# Patient Record
Sex: Male | Born: 1960
Health system: Southern US, Community
[De-identification: ages and names within clinical notes are randomized; demographics above are authoritative.]

## PROBLEM LIST (undated history)

## (undated) DIAGNOSIS — F419 Anxiety disorder, unspecified: Secondary | ICD-10-CM

## (undated) DIAGNOSIS — M1990 Unspecified osteoarthritis, unspecified site: Secondary | ICD-10-CM

## (undated) DIAGNOSIS — R112 Nausea with vomiting, unspecified: Secondary | ICD-10-CM

## (undated) DIAGNOSIS — Z87442 Personal history of urinary calculi: Secondary | ICD-10-CM

## (undated) DIAGNOSIS — E349 Endocrine disorder, unspecified: Secondary | ICD-10-CM

## (undated) DIAGNOSIS — M4316 Spondylolisthesis, lumbar region: Secondary | ICD-10-CM

## (undated) DIAGNOSIS — M5412 Radiculopathy, cervical region: Secondary | ICD-10-CM

## (undated) DIAGNOSIS — G4733 Obstructive sleep apnea (adult) (pediatric): Secondary | ICD-10-CM

## (undated) DIAGNOSIS — K603 Anal fistula, unspecified: Secondary | ICD-10-CM

## (undated) DIAGNOSIS — T7840XA Allergy, unspecified, initial encounter: Secondary | ICD-10-CM

## (undated) DIAGNOSIS — M47816 Spondylosis without myelopathy or radiculopathy, lumbar region: Secondary | ICD-10-CM

## (undated) DIAGNOSIS — K219 Gastro-esophageal reflux disease without esophagitis: Secondary | ICD-10-CM

## (undated) DIAGNOSIS — M545 Low back pain, unspecified: Secondary | ICD-10-CM

## (undated) DIAGNOSIS — Z9889 Other specified postprocedural states: Secondary | ICD-10-CM

## (undated) DIAGNOSIS — M4802 Spinal stenosis, cervical region: Secondary | ICD-10-CM

## (undated) DIAGNOSIS — M47812 Spondylosis without myelopathy or radiculopathy, cervical region: Secondary | ICD-10-CM

## (undated) DIAGNOSIS — G4730 Sleep apnea, unspecified: Secondary | ICD-10-CM

## (undated) DIAGNOSIS — R29898 Other symptoms and signs involving the musculoskeletal system: Secondary | ICD-10-CM

## (undated) DIAGNOSIS — T8859XA Other complications of anesthesia, initial encounter: Secondary | ICD-10-CM

## (undated) DIAGNOSIS — R519 Headache, unspecified: Secondary | ICD-10-CM

## (undated) DIAGNOSIS — E785 Hyperlipidemia, unspecified: Secondary | ICD-10-CM

## (undated) DIAGNOSIS — R42 Dizziness and giddiness: Secondary | ICD-10-CM

## (undated) DIAGNOSIS — M5416 Radiculopathy, lumbar region: Secondary | ICD-10-CM

## (undated) DIAGNOSIS — C4491 Basal cell carcinoma of skin, unspecified: Secondary | ICD-10-CM

## (undated) DIAGNOSIS — F32A Depression, unspecified: Secondary | ICD-10-CM

## (undated) DIAGNOSIS — T4145XA Adverse effect of unspecified anesthetic, initial encounter: Secondary | ICD-10-CM

## (undated) DIAGNOSIS — C801 Malignant (primary) neoplasm, unspecified: Secondary | ICD-10-CM

## (undated) DIAGNOSIS — T884XXA Failed or difficult intubation, initial encounter: Secondary | ICD-10-CM

## (undated) DIAGNOSIS — A6920 Lyme disease, unspecified: Secondary | ICD-10-CM

## (undated) HISTORY — DX: Allergy, unspecified, initial encounter: T78.40XA

## (undated) HISTORY — PX: WISDOM TOOTH EXTRACTION: SHX21

## (undated) HISTORY — PX: TONSILLECTOMY: SUR1361

## (undated) HISTORY — PX: APPENDECTOMY: SHX54

## (undated) HISTORY — PX: SHOULDER ARTHROSCOPY: SHX128

## (undated) HISTORY — PX: HERNIA REPAIR: SHX51

---

## 2006-11-25 DIAGNOSIS — J309 Allergic rhinitis, unspecified: Secondary | ICD-10-CM | POA: Insufficient documentation

## 2007-05-05 DIAGNOSIS — F4322 Adjustment disorder with anxiety: Secondary | ICD-10-CM | POA: Insufficient documentation

## 2007-12-20 ENCOUNTER — Emergency Department: Payer: Self-pay | Admitting: Unknown Physician Specialty

## 2009-09-30 IMAGING — CT CT ABD-PELV W/O CM
1 of 2 series · 15 of 32 positions shown, 19 images · non-contrast
Comparison: none

REASON FOR EXAM: (1) right flank pain; (2) right flank pain
COMMENTS:

[Series 2: stone · axial · 0.74mm/px · z∈[-317,+109]mm · 15 of 154 slices shown, 19 images]
[im 6/154  soft-tissue]
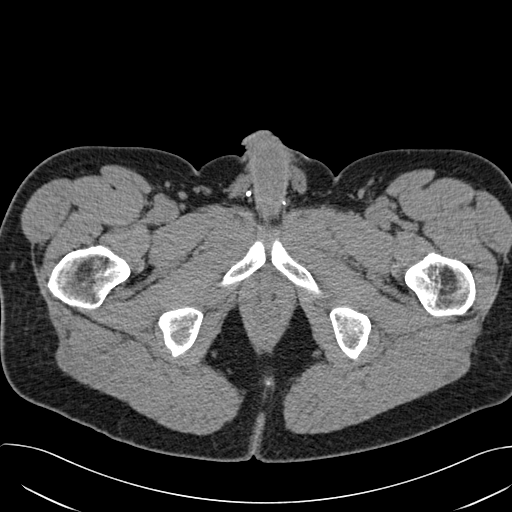
[im 6/154  bone]
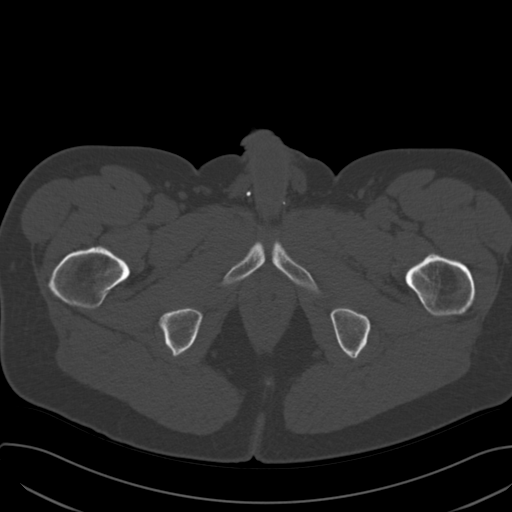
[im 18/154  soft-tissue]
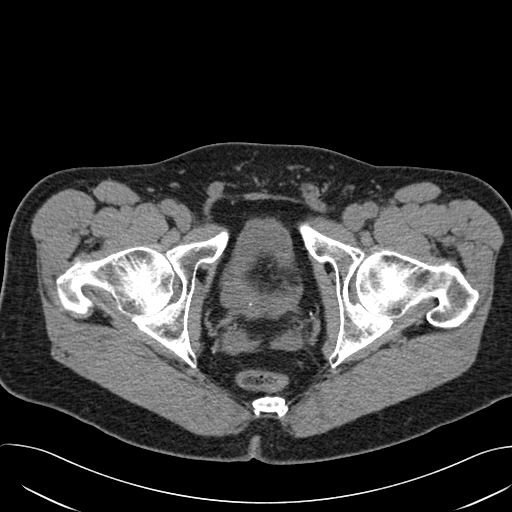
[im 30/154  soft-tissue]
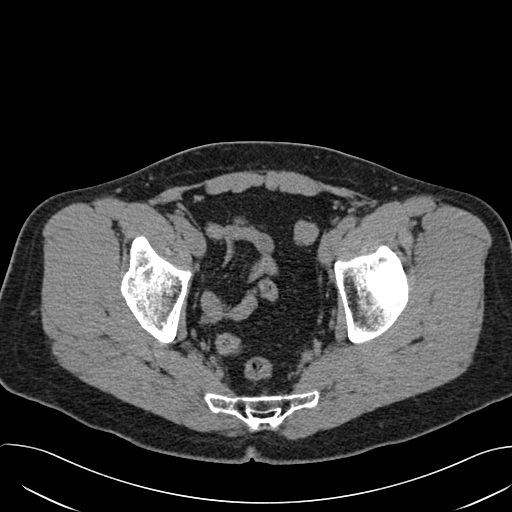
[im 42/154  soft-tissue]
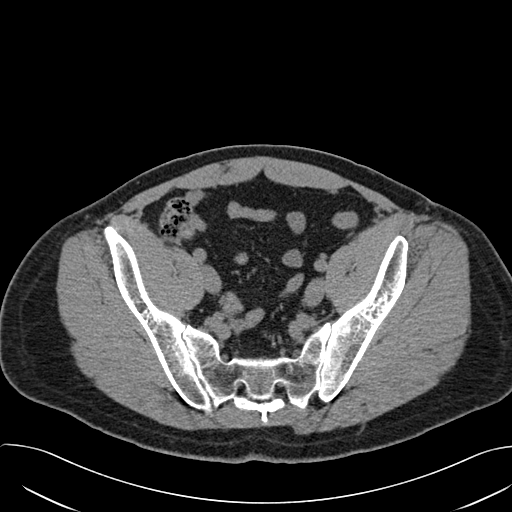
[im 53/154  soft-tissue]
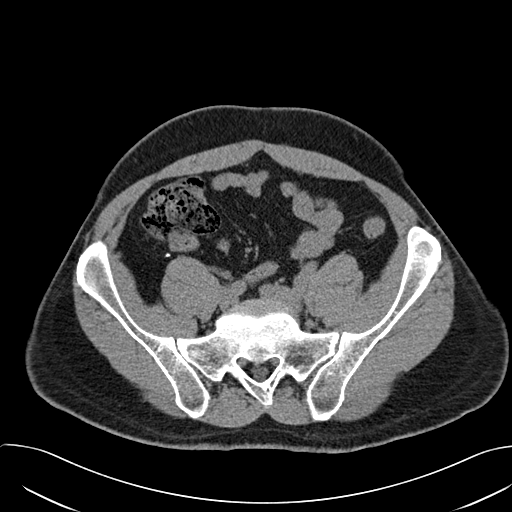
[im 65/154  soft-tissue]
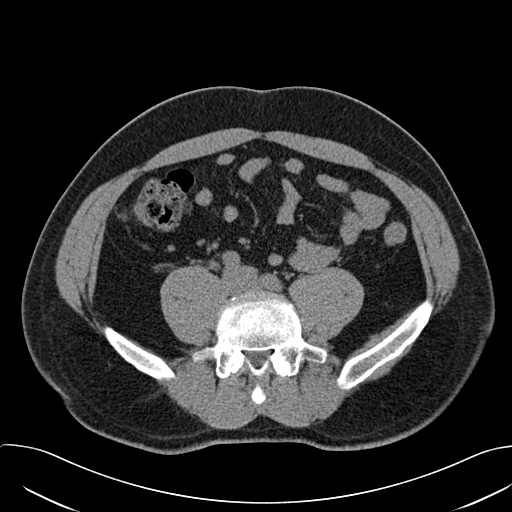
[im 77/154  soft-tissue]
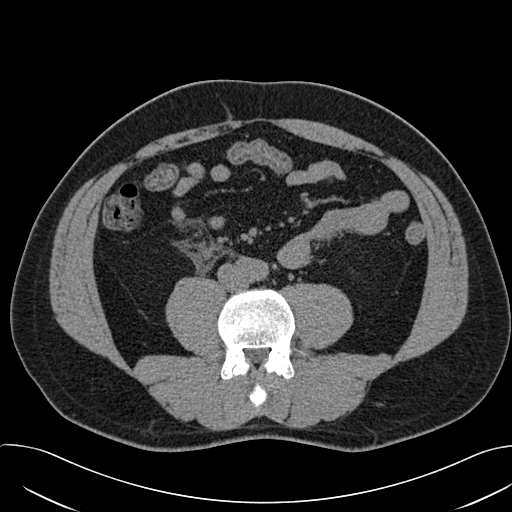
[im 89/154  soft-tissue]
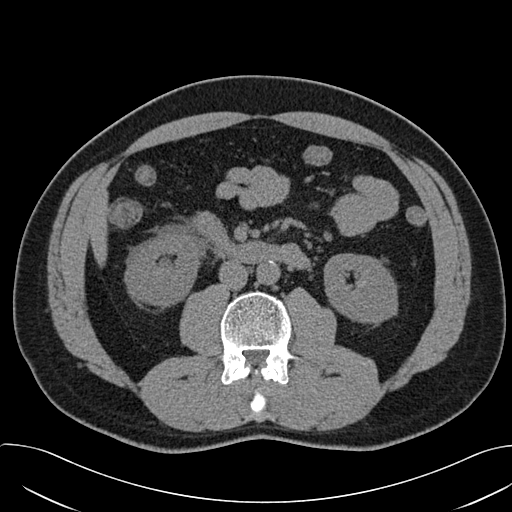
[im 101/154  soft-tissue]
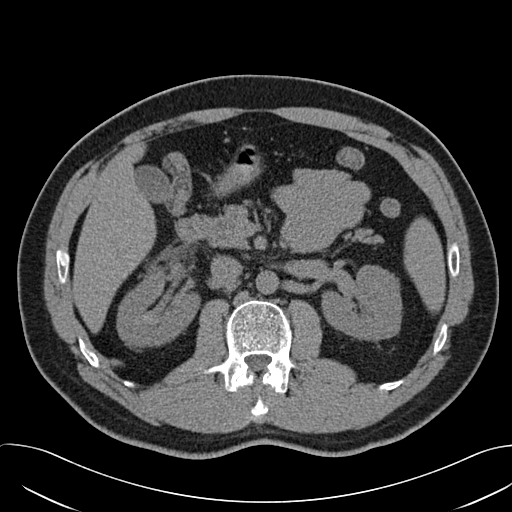
[im 101/154  bone]
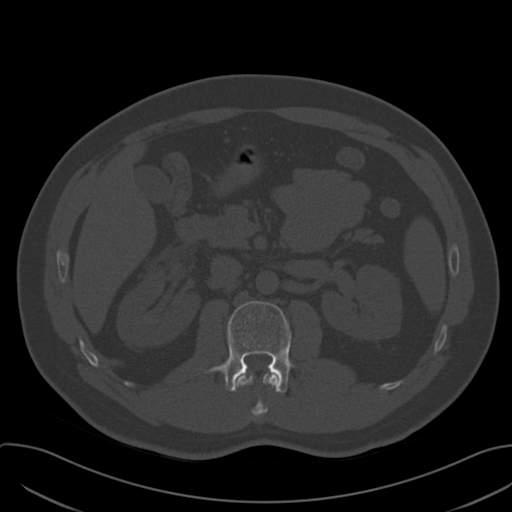
[im 112/154  soft-tissue]
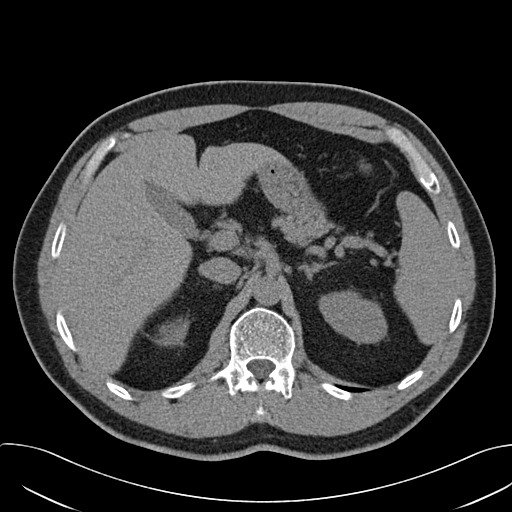
[im 124/154  soft-tissue]
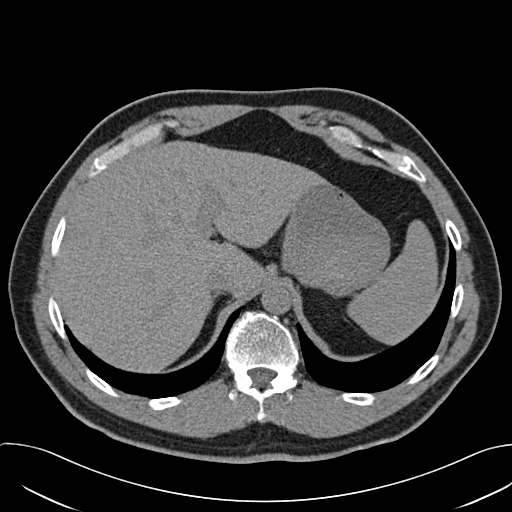
[im 130/154  lung]
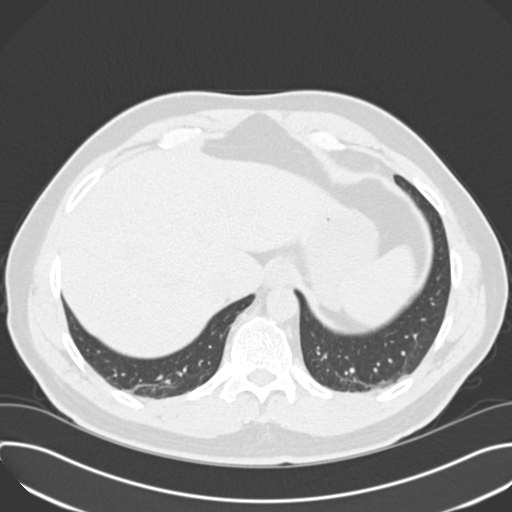
[im 136/154  soft-tissue]
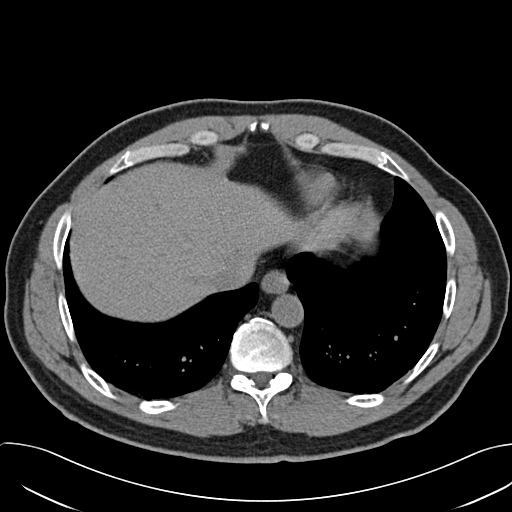
[im 136/154  lung]
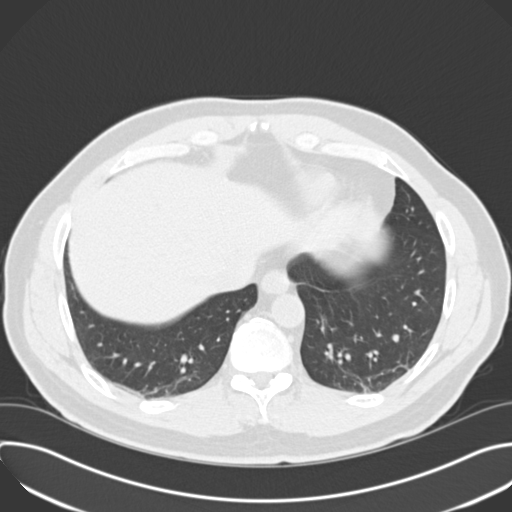
[im 142/154  lung]
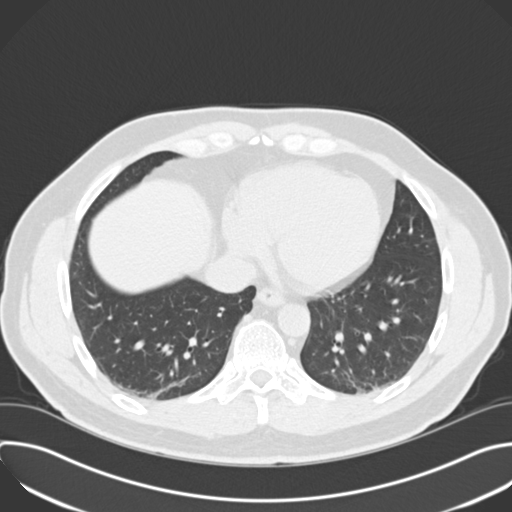
[im 148/154  soft-tissue]
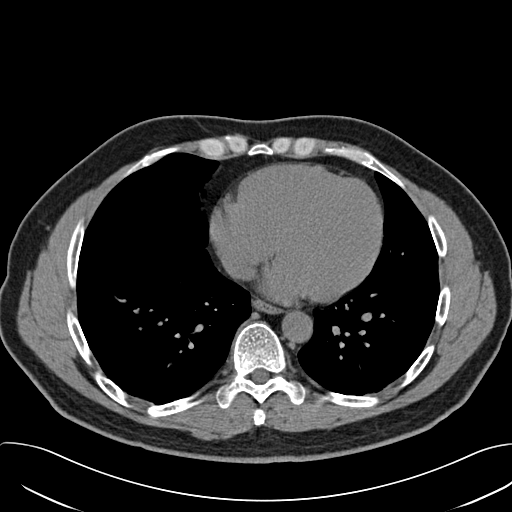
[im 148/154  lung]
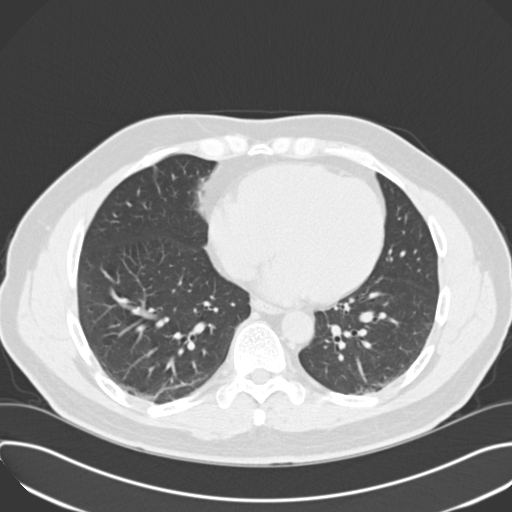

[15 of 32 positions shown; findings below may reference images not displayed]

PROCEDURE:     CT  - CT ABDOMEN AND PELVIS W[DATE]  [DATE]

RESULT:     Noncontrast emergent CT of the abdomen and pelvis is performed
utilizing a renal stone protocol. There is no previous examination for
comparison. Reconstructions are performed at 3 mm slice thickness in the
axial plane.

Physician performed multiplanar reconstructions are utilized for
interpretation at time of dictation. These are created utilizing the web
space software.

There is a prominent amount of perinephric stranding and periureteral
stranding on the right with mild hydroureter and hydronephrosis on the
right. No definite ureteral stone is evident. There does appear to be a
stone within the bladder which may have recently passed. This is
demonstrated on image #138. No additional renal stones are evident. There
are surgical clips in the right lower quadrant suggestive of previous
appendectomy. Correlation with surgical history is suggested. There is no
free fluid, free air in or abnormal air-fluid collection. The upper
abdominal structures otherwise appear to be grossly normal. The lung bases
demonstrate minimal dependent atelectasis.
IMPRESSION: 1. Findings suggestive of a recently passed right ureteral stone was some
residual and mild hydroureter and hydronephrosis along with periureteric and
perirenal stranding.

2. Findings consistent with previous appendectomy.

## 2012-12-22 ENCOUNTER — Ambulatory Visit: Payer: Self-pay | Admitting: Unknown Physician Specialty

## 2012-12-22 LAB — HM COLONOSCOPY

## 2014-02-16 HISTORY — PX: COLONOSCOPY: SHX174

## 2014-05-22 LAB — CBC AND DIFFERENTIAL
HCT: 43 % (ref 41–53)
HEMOGLOBIN: 14.8 g/dL (ref 13.5–17.5)
Neutrophils Absolute: 2 /uL
Platelets: 251 10*3/uL (ref 150–399)
WBC: 5.7 10^3/mL

## 2014-05-22 LAB — BASIC METABOLIC PANEL
BUN: 20 mg/dL (ref 4–21)
CREATININE: 1 mg/dL (ref <=1.3)
Glucose: 105 mg/dL
Potassium: 4.6 mmol/L (ref 3.4–5.3)
Sodium: 143 mmol/L (ref 137–147)

## 2014-05-22 LAB — LIPID PANEL
Cholesterol: 232 mg/dL — AB (ref 0–200)
HDL: 39 mg/dL (ref 35–70)
LDL Cholesterol: 156 mg/dL
LDL/HDL RATIO: 4
TRIGLYCERIDES: 185 mg/dL — AB (ref 40–160)

## 2014-05-22 LAB — HEPATIC FUNCTION PANEL
ALK PHOS: 92 U/L (ref 25–125)
ALT: 18 U/L (ref 10–40)
AST: 16 U/L (ref 14–40)

## 2014-05-22 LAB — PSA: PSA: 0.5

## 2014-05-22 LAB — HEMOGLOBIN A1C: Hgb A1c MFr Bld: 6.2 % — AB (ref 4.0–6.0)

## 2014-05-22 LAB — TSH: TSH: 2.92 u[IU]/mL (ref <=5.90)

## 2014-10-29 ENCOUNTER — Other Ambulatory Visit: Payer: Self-pay | Admitting: Family Medicine

## 2014-11-20 ENCOUNTER — Other Ambulatory Visit: Payer: Self-pay

## 2014-11-20 MED ORDER — CITALOPRAM HYDROBROMIDE 20 MG PO TABS: 20.0000 mg | ORAL_TABLET | Freq: Every day | ORAL | Status: DC

## 2014-11-21 ENCOUNTER — Ambulatory Visit: Payer: Self-pay | Admitting: Family Medicine

## 2014-11-25 ENCOUNTER — Other Ambulatory Visit: Payer: Self-pay | Admitting: Family Medicine

## 2014-11-30 DIAGNOSIS — A6920 Lyme disease, unspecified: Secondary | ICD-10-CM | POA: Insufficient documentation

## 2014-11-30 DIAGNOSIS — F32A Depression, unspecified: Secondary | ICD-10-CM | POA: Insufficient documentation

## 2014-11-30 DIAGNOSIS — K219 Gastro-esophageal reflux disease without esophagitis: Secondary | ICD-10-CM | POA: Insufficient documentation

## 2014-11-30 DIAGNOSIS — J45909 Unspecified asthma, uncomplicated: Secondary | ICD-10-CM | POA: Insufficient documentation

## 2014-11-30 DIAGNOSIS — F329 Major depressive disorder, single episode, unspecified: Secondary | ICD-10-CM | POA: Insufficient documentation

## 2014-11-30 DIAGNOSIS — E785 Hyperlipidemia, unspecified: Secondary | ICD-10-CM | POA: Insufficient documentation

## 2014-11-30 DIAGNOSIS — M5416 Radiculopathy, lumbar region: Secondary | ICD-10-CM | POA: Insufficient documentation

## 2014-11-30 DIAGNOSIS — G4700 Insomnia, unspecified: Secondary | ICD-10-CM | POA: Insufficient documentation

## 2014-11-30 DIAGNOSIS — G4733 Obstructive sleep apnea (adult) (pediatric): Secondary | ICD-10-CM | POA: Insufficient documentation

## 2014-11-30 DIAGNOSIS — R739 Hyperglycemia, unspecified: Secondary | ICD-10-CM | POA: Insufficient documentation

## 2014-11-30 DIAGNOSIS — E349 Endocrine disorder, unspecified: Secondary | ICD-10-CM | POA: Insufficient documentation

## 2014-12-03 ENCOUNTER — Ambulatory Visit (INDEPENDENT_AMBULATORY_CARE_PROVIDER_SITE_OTHER): Payer: 59 | Admitting: Family Medicine

## 2014-12-03 ENCOUNTER — Encounter: Payer: Self-pay | Admitting: Family Medicine

## 2014-12-03 VITALS — BP 122/70 | HR 64 | Temp 98.3°F | Resp 16 | Ht 72.0 in | Wt 230.6 lb

## 2014-12-03 DIAGNOSIS — F329 Major depressive disorder, single episode, unspecified: Secondary | ICD-10-CM | POA: Diagnosis not present

## 2014-12-03 DIAGNOSIS — F32A Depression, unspecified: Secondary | ICD-10-CM

## 2014-12-03 DIAGNOSIS — K219 Gastro-esophageal reflux disease without esophagitis: Secondary | ICD-10-CM | POA: Diagnosis not present

## 2014-12-03 DIAGNOSIS — Z23 Encounter for immunization: Secondary | ICD-10-CM | POA: Diagnosis not present

## 2014-12-03 NOTE — Progress Notes (Signed)
       Patient: Drew Lopez Male    DOB: [birth year]   54 y.o.   MRN: [id number] Visit Date: 12/03/2014  Today's Provider: [name], MD   Chief Complaint  Patient presents with  . Depression  . Gastroesophageal Reflux   Subjective:    HPI Depression: Patient here to follow up on depression. He complains of fatigue. Onset was approximately several years ago, stable since that time.  He denies current suicidal and homicidal plan or intent.   Family history significant for no psychiatric illness.Possible organic causes contributing are: none.  Risk factors: previous episode of depression Previous treatment includes Citalopram and none. He complains of the following side effects from the treatment: none. Patient had lightheadedness and dizziness 6 days ago. Reason didn't have the medicine. Now feels good.   GERD: Patient here to follow up on GERD. This has been associated with no other symptoms.  He denies no other symptoms. Symptoms have been present for 0 none.  He has not lost weight. Medical therapy in the past has included antacids Ameprazole.   Allergies  Allergen Reactions  . Promethazine    Previous Medications   ALBUTEROL (PROAIR HFA) 108 (90 BASE) MCG/ACT INHALER    Inhale into the lungs.   ASPIRIN 81 MG TABLET    Take by mouth.   B COMPLEX VITAMINS PO    Take by mouth.   CHOLECALCIFEROL (VITAMIN D) 1000 UNITS TABLET    Take by mouth.   CITALOPRAM (CELEXA) 20 MG TABLET    TAKE 2 TABLETS BY MOUTH EVERY DAY   FEXOFENADINE (ALLEGRA ALLERGY) 180 MG TABLET    Take by mouth.   FINASTERIDE (PROSCAR) 5 MG TABLET    TAKE ONE TABLET BY MOUTH EVERY DAY   LORAZEPAM (ATIVAN) 1 MG TABLET    Take by mouth.   MULTIPLE VITAMIN TABLET    Take by mouth.   OMEPRAZOLE (PRILOSEC) 20 MG CAPSULE    Take by mouth.    Review of Systems  Constitutional: Negative.   Respiratory: Negative.   Cardiovascular: Negative.   Endocrine: Negative.   Allergic/Immunologic: Negative.     Neurological: Negative.   Psychiatric/Behavioral: Negative.     Social History  Substance Use Topics  . Smoking status: Never Smoker   . Smokeless tobacco: Never Used  . Alcohol Use: No   Objective:   BP 122/70 mmHg  Pulse 64  Temp(Src) 98.3 F (36.8 C) (Oral)  Resp 16  Ht 6' (1.829 m)  Wt 230 lb 9.6 oz (104.599 kg)  BMI 31.27 kg/m2  Physical Exam  Constitutional: He is oriented to person, place, and time. He appears well-developed and well-nourished.  HENT:  Head: Normocephalic and atraumatic.  Right Ear: External ear normal.  Left Ear: External ear normal.  Nose: Nose normal.  Eyes: Conjunctivae are normal.  Neck: Neck supple.  Cardiovascular: Normal rate, regular rhythm and normal heart sounds.   Pulmonary/Chest: Effort normal and breath sounds normal.  Abdominal: Soft.  Neurological: He is alert and oriented to person, place, and time.  Skin: Skin is warm and dry.  Psychiatric: He has a normal mood and affect. His behavior is normal. Judgment and thought content normal.        Assessment & Plan:     1. Clinical depression In remission.  2. Gastroesophageal reflux disease, esophagitis presence not specified On PPI.      Drew Lopez Mon, MD  [facility]

## 2014-12-28 ENCOUNTER — Other Ambulatory Visit: Payer: Self-pay | Admitting: Family Medicine

## 2015-02-27 ENCOUNTER — Other Ambulatory Visit: Payer: Self-pay | Admitting: Family Medicine

## 2015-03-31 ENCOUNTER — Other Ambulatory Visit: Payer: Self-pay | Admitting: Family Medicine

## 2015-05-04 ENCOUNTER — Other Ambulatory Visit: Payer: Self-pay | Admitting: Family Medicine

## 2015-06-04 ENCOUNTER — Encounter: Payer: 59 | Admitting: Family Medicine

## 2015-07-16 ENCOUNTER — Ambulatory Visit (INDEPENDENT_AMBULATORY_CARE_PROVIDER_SITE_OTHER): Payer: 59 | Admitting: Family Medicine

## 2015-07-16 VITALS — BP 120/78 | HR 68 | Temp 97.8°F | Resp 16 | Ht 72.0 in | Wt 235.0 lb

## 2015-07-16 DIAGNOSIS — Z0000 Encounter for general adult medical examination without abnormal findings: Secondary | ICD-10-CM | POA: Diagnosis not present

## 2015-07-16 DIAGNOSIS — Z125 Encounter for screening for malignant neoplasm of prostate: Secondary | ICD-10-CM | POA: Diagnosis not present

## 2015-07-16 LAB — POCT URINALYSIS DIPSTICK
Bilirubin, UA: NEGATIVE
GLUCOSE UA: NEGATIVE
KETONES UA: NEGATIVE
Leukocytes, UA: NEGATIVE
Nitrite, UA: NEGATIVE
PROTEIN UA: NEGATIVE
RBC UA: NEGATIVE
UROBILINOGEN UA: NEGATIVE
pH, UA: 6

## 2015-07-16 NOTE — Progress Notes (Signed)
Patient ID: Drew Lopez, male   DOB: 1960-08-27, 54 y.o.   MRN: [id number]  Patient: Drew Lopez, Male    DOB: [birth year], 55 y.o.   MRN: [id number] Visit Date: 07/16/2015  Today's Provider: [name], MD   Chief Complaint  Patient presents with  . Annual Exam   Subjective:  Drew Lopez is a 55 y.o. male who presents today for health maintenance and complete physical. He feels fairly well. He reports exercising none. He reports he is sleeping well. Patient state she sleeps well when he sleeps but he feels he just doesn't sleep enough. Wears CPAP nightly and he feels better on this. Colonoscopy is overdue- due 2012  Tdap done  11/25/2006  Hep C screening  Done 2014   Review of Systems  Constitutional: Negative.   HENT: Negative.   Eyes: Negative.   Respiratory: Negative.   Cardiovascular: Negative.   Gastrointestinal: Negative.   Endocrine: Negative.   Genitourinary: Negative.   Musculoskeletal: Positive for back pain, neck pain and neck stiffness.  Skin: Negative.   Allergic/Immunologic: Negative.   Neurological: Negative.   Hematological: Negative.   Psychiatric/Behavioral: Negative.     Social History   Social History  . Marital Status: Divorced    Spouse Name: N/A  . Number of Children: N/A  . Years of Education: N/A   Occupational History  . Not on file.   Social History Main Topics  . Smoking status: Never Smoker   . Smokeless tobacco: Never Used  . Alcohol Use: No  . Drug Use: No  . Sexual Activity: Not on file   Other Topics Concern  . Not on file   Social History Narrative    Patient Active Problem List   Diagnosis Date Noted  . AB (asthmatic bronchitis) 11/30/2014  . Clinical depression 11/30/2014  . GERD (gastroesophageal reflux disease) 11/30/2014  . HLD (hyperlipidemia) 11/30/2014  . Blood glucose elevated 11/30/2014  . Cannot sleep 11/30/2014  . Hypotestosteronism 11/30/2014  . Lumbar radiculopathy 11/30/2014  . Lyme  borreliosis 11/30/2014  . Obstructive apnea 11/30/2014  . Adjustment disorder with anxiety 05/05/2007  . Allergic rhinitis 11/25/2006    Past Surgical History  Procedure Laterality Date  . Appendectomy      His family history includes Alzheimer's disease in his father.    Outpatient Prescriptions Prior to Visit  Medication Sig Dispense Refill  . albuterol (PROAIR HFA) 108 (90 BASE) MCG/ACT inhaler Inhale into the lungs.    [name] aspirin 81 MG tablet Take by mouth.    . B COMPLEX VITAMINS PO Take by mouth.    . cholecalciferol (VITAMIN D) 1000 UNITS tablet Take by mouth.    . citalopram (CELEXA) 20 MG tablet TAKE 2 TABLETS BY MOUTH EVERY DAY 180 tablet 3  . fexofenadine (ALLEGRA ALLERGY) 180 MG tablet Take by mouth.    . finasteride (PROSCAR) 5 MG tablet TAKE ONE TABLET BY MOUTH EVERY DAY 30 tablet 12  . Multiple Vitamin tablet Take by mouth.    [name] omeprazole (PRILOSEC) 20 MG capsule TAKE ONE CAPSULE BY MOUTH EVERY DAY 30 capsule 12  . LORazepam (ATIVAN) 1 MG tablet Take by mouth.     No facility-administered medications prior to visit.    Patient Care Team: [name]., MD as PCP - General (Family Medicine)     Objective:   Vitals:  Filed Vitals:   07/16/15 1037  BP: 120/78  Pulse: 68  Temp: 97.8 F (36.6 C)  TempSrc: Oral  Resp: 16  Height: 6' (1.829 m)  Weight: 235 lb (106.595 kg)    Physical Exam  Constitutional: He is oriented to person, place, and time. He appears well-developed.  HENT:  Head: Normocephalic and atraumatic.  Right Ear: External ear normal.  Left Ear: External ear normal.  Nose: Nose normal.  Mouth/Throat: Oropharynx is clear and moist.  Eyes: Conjunctivae and EOM are normal. Pupils are equal, round, and reactive to light.  Neck: Normal range of motion. Neck supple.  Cardiovascular: Normal rate, normal heart sounds and intact distal pulses.   Pulmonary/Chest: Effort normal and breath sounds normal.  Abdominal: Soft. Bowel sounds are  normal.  Genitourinary: Rectum normal, prostate normal and penis normal.  Musculoskeletal: Normal range of motion.  Neurological: He is alert and oriented to person, place, and time.  Skin: Skin is warm and dry.  Psychiatric: He has a normal mood and affect. His behavior is normal. Judgment and thought content normal.     Depression Screen PHQ 2/9 Scores 07/16/2015  PHQ - 2 Score 2  PHQ- 9 Score 7      Assessment & Plan:     Routine Health Maintenance and Physical Exam  Exercise Activities and Dietary recommendations Goals    None      Immunization History  Administered Date(s) Administered  . Influenza,inj,Quad PF,36+ Mos 12/03/2014  . Tdap 11/25/2006    Health Maintenance  Topic Date Due  . HIV Screening  11/17/1975  . COLONOSCOPY  11/17/2010  . INFLUENZA VACCINE  09/17/2015  . TETANUS/TDAP  11/24/2016  . Hepatitis C Screening  Addressed     Dr [name] did colonoscopy 2016. Discussed health benefits of physical activity, and encouraged him to engage in regular exercise appropriate for his age and condition.   I have done the exam and reviewed the above chart and it is accurate to the best of my knowledge.  ------------------------------------------------------------------------------------------------------------

## 2015-07-17 LAB — CBC WITH DIFFERENTIAL/PLATELET
Basophils Absolute: 0 10*3/uL (ref 0.0–0.2)
Basos: 1 %
EOS (ABSOLUTE): 0.2 10*3/uL (ref 0.0–0.4)
EOS: 4 %
HEMATOCRIT: 42.1 % (ref 37.5–51.0)
HEMOGLOBIN: 14.6 g/dL (ref 12.6–17.7)
Immature Grans (Abs): 0 10*3/uL (ref 0.0–0.1)
Immature Granulocytes: 0 %
LYMPHS ABS: 2.5 10*3/uL (ref 0.7–3.1)
Lymphs: 39 %
MCH: 29.6 pg (ref 26.6–33.0)
MCHC: 34.7 g/dL (ref 31.5–35.7)
MCV: 85 fL (ref 79–97)
MONOCYTES: 9 %
MONOS ABS: 0.6 10*3/uL (ref 0.1–0.9)
NEUTROS ABS: 3 10*3/uL (ref 1.4–7.0)
Neutrophils: 47 %
Platelets: 270 10*3/uL (ref 150–379)
RBC: 4.94 x10E6/uL (ref 4.14–5.80)
RDW: 13.3 % (ref 12.3–15.4)
WBC: 6.4 10*3/uL (ref 3.4–10.8)

## 2015-07-17 LAB — PSA: PROSTATE SPECIFIC AG, SERUM: 0.3 ng/mL (ref 0.0–4.0)

## 2015-07-17 LAB — COMPREHENSIVE METABOLIC PANEL
A/G RATIO: 1.8 (ref 1.2–2.2)
ALBUMIN: 4.6 g/dL (ref 3.5–5.5)
ALK PHOS: 94 IU/L (ref 39–117)
ALT: 24 IU/L (ref 0–44)
AST: 21 IU/L (ref 0–40)
BILIRUBIN TOTAL: 0.7 mg/dL (ref 0.0–1.2)
BUN / CREAT RATIO: 19 (ref 9–20)
BUN: 20 mg/dL (ref 6–24)
CHLORIDE: 102 mmol/L (ref 96–106)
CO2: 26 mmol/L (ref 18–29)
CREATININE: 1.03 mg/dL (ref 0.76–1.27)
Calcium: 9.4 mg/dL (ref 8.7–10.2)
GFR calc Af Amer: 95 mL/min/{1.73_m2} (ref >59)
GFR calc non Af Amer: 82 mL/min/{1.73_m2} (ref >59)
GLOBULIN, TOTAL: 2.5 g/dL (ref 1.5–4.5)
Glucose: 100 mg/dL — ABNORMAL HIGH (ref 65–99)
POTASSIUM: 4.4 mmol/L (ref 3.5–5.2)
SODIUM: 144 mmol/L (ref 134–144)
Total Protein: 7.1 g/dL (ref 6.0–8.5)

## 2015-07-17 LAB — TSH: TSH: 2.06 u[IU]/mL (ref 0.450–4.500)

## 2015-07-17 LAB — LIPID PANEL WITH LDL/HDL RATIO
CHOLESTEROL TOTAL: 222 mg/dL — AB (ref 100–199)
HDL: 36 mg/dL — ABNORMAL LOW (ref >39)
LDL CALC: 149 mg/dL — AB (ref 0–99)
LDl/HDL Ratio: 4.1 ratio units — ABNORMAL HIGH (ref 0.0–3.6)
TRIGLYCERIDES: 187 mg/dL — AB (ref 0–149)
VLDL Cholesterol Cal: 37 mg/dL (ref 5–40)

## 2015-07-18 ENCOUNTER — Telehealth: Payer: Self-pay | Admitting: Family Medicine

## 2015-07-18 NOTE — Telephone Encounter (Signed)
Pt informed and voiced understanding of results. 

## 2015-07-18 NOTE — Telephone Encounter (Signed)
Pt called saying he returned Ana's call.    Thanks , Con Memos

## 2015-10-09 ENCOUNTER — Ambulatory Visit (INDEPENDENT_AMBULATORY_CARE_PROVIDER_SITE_OTHER): Payer: 59 | Admitting: Family Medicine

## 2015-10-09 ENCOUNTER — Encounter: Payer: Self-pay | Admitting: Family Medicine

## 2015-10-09 VITALS — BP 126/74 | HR 72 | Temp 98.1°F | Resp 16 | Ht 72.5 in | Wt 241.0 lb

## 2015-10-09 DIAGNOSIS — K648 Other hemorrhoids: Secondary | ICD-10-CM

## 2015-10-09 LAB — IFOBT (OCCULT BLOOD): IMMUNOLOGICAL FECAL OCCULT BLOOD TEST: NEGATIVE

## 2015-10-09 MED ORDER — HYDROCORTISONE ACETATE 25 MG RE SUPP: 25.0000 mg | Freq: Two times a day (BID) | RECTAL | 0 refills | Status: DC

## 2015-10-09 NOTE — Progress Notes (Signed)
       Patient: Drew Lopez Male    DOB: 1960/09/29   55 y.o.   MRN: [id number] Visit Date: 10/09/2015  Today's Provider: [name], MD   Chief Complaint  Patient presents with  . Hemorrhoids    possibly. Since 1 month.    Subjective:    HPI Patient reports that he possibly has a hemorrhoid. He reports that he first noticed it about 1 month ago. Patient reports that the nodule seems to be more swollen at times. He denies any difficuilty having bowel movements. Patient has not been using anything OTC for symptoms. Patient reports that he has been doing more heavy lifting, and it may have contributed to his symptoms.     Allergies  Allergen Reactions  . Promethazine    Current Meds  Medication Sig  . albuterol (PROAIR HFA) 108 (90 BASE) MCG/ACT inhaler Inhale into the lungs.  . cholecalciferol (VITAMIN D) 1000 UNITS tablet Take by mouth.  . citalopram (CELEXA) 20 MG tablet TAKE 2 TABLETS BY MOUTH EVERY DAY  . fexofenadine (ALLEGRA ALLERGY) 180 MG tablet Take by mouth.  . finasteride (PROSCAR) 5 MG tablet TAKE ONE TABLET BY MOUTH EVERY DAY  . Multiple Vitamin tablet Take by mouth.  [name] omeprazole (PRILOSEC) 20 MG capsule TAKE ONE CAPSULE BY MOUTH EVERY DAY    Review of Systems  Constitutional: Negative.   Eyes: Negative.   Respiratory: Negative.   Cardiovascular: Negative.   Gastrointestinal: Positive for rectal pain. Negative for abdominal distention, abdominal pain, anal bleeding, blood in stool, constipation, diarrhea, nausea and vomiting.  Endocrine: Negative.   Musculoskeletal: Negative.   Skin: Negative.   Allergic/Immunologic: Negative.   Psychiatric/Behavioral: Negative.     Social History  Substance Use Topics  . Smoking status: Never Smoker  . Smokeless tobacco: Never Used  . Alcohol use No   Objective:   BP 126/74 (BP Location: Right Arm, Patient Position: Sitting, Cuff Size: Large)   Pulse 72   Temp 98.1 F (36.7 C)   Resp 16   Ht 6' 0.5"  (1.842 m)   Wt 241 lb (109.3 kg)   BMI 32.24 kg/m   Physical Exam  Constitutional: He appears well-developed and well-nourished.  HENT:  Head: Normocephalic and atraumatic.  Eyes: Conjunctivae are normal.  Cardiovascular: Normal rate and regular rhythm.   Pulmonary/Chest: Effort normal and breath sounds normal.  Abdominal: Soft. Bowel sounds are normal.  Genitourinary: Rectal exam shows internal hemorrhoid. Rectal exam shows guaiac negative stool.  Skin: Skin is warm and dry.  Psychiatric: He has a normal mood and affect. His behavior is normal. Judgment and thought content normal.  Anoscopy reveals no active bleeding and no thrombosis.      Assessment & Plan:     1. Internal hemorrhoid Will treat with anusol as below. Patient will call if symptoms worsen.  - hydrocortisone (ANUSOL-HC) 25 MG suppository; Place 1 suppository (25 mg total) rectally 2 (two) times daily.  Dispense: 12 suppository; Refill: 0       I have done the exam and reviewed the above chart and it is accurate to the best of my knowledge.  Kaneesha Constantino Cranford Mon, MD  [facility]

## 2015-10-09 NOTE — Patient Instructions (Signed)
May use stool softener daily to help with soften stools.

## 2015-11-02 ENCOUNTER — Other Ambulatory Visit: Payer: Self-pay | Admitting: Family Medicine

## 2015-12-20 ENCOUNTER — Ambulatory Visit (INDEPENDENT_AMBULATORY_CARE_PROVIDER_SITE_OTHER): Payer: 59

## 2015-12-20 DIAGNOSIS — Z23 Encounter for immunization: Secondary | ICD-10-CM | POA: Diagnosis not present

## 2015-12-31 ENCOUNTER — Ambulatory Visit: Payer: 59 | Admitting: Family Medicine

## 2016-01-03 ENCOUNTER — Other Ambulatory Visit: Payer: Self-pay | Admitting: Family Medicine

## 2016-01-20 ENCOUNTER — Ambulatory Visit (INDEPENDENT_AMBULATORY_CARE_PROVIDER_SITE_OTHER): Payer: 59 | Admitting: General Surgery

## 2016-01-20 ENCOUNTER — Ambulatory Visit (INDEPENDENT_AMBULATORY_CARE_PROVIDER_SITE_OTHER): Payer: 59 | Admitting: Family Medicine

## 2016-01-20 ENCOUNTER — Encounter: Payer: Self-pay | Admitting: General Surgery

## 2016-01-20 VITALS — BP 134/72 | HR 76 | Resp 12 | Ht 72.0 in | Wt 237.0 lb

## 2016-01-20 VITALS — BP 132/80 | HR 88 | Temp 99.1°F

## 2016-01-20 DIAGNOSIS — K610 Anal abscess: Secondary | ICD-10-CM

## 2016-01-20 DIAGNOSIS — J321 Chronic frontal sinusitis: Secondary | ICD-10-CM

## 2016-01-20 MED ORDER — AMOXICILLIN-POT CLAVULANATE 875-125 MG PO TABS: 1.0000 | ORAL_TABLET | Freq: Two times a day (BID) | ORAL | 0 refills | Status: DC

## 2016-01-20 NOTE — Patient Instructions (Addendum)
Return in one week.  

## 2016-01-20 NOTE — Progress Notes (Signed)
Drew Lopez  MRN: [id number] DOB: [birth year]  Subjective:  HPI   The patient is a 55 year old male who presents for evaluation of an internal hemorrhoid.  The patient was seen for an internal hemorrhoid in August.  He stated that after being seen he didn't get the prescription filled because it resolved on it's own.  The patient states that over the last 10 days or so he has had another flare up and admitted that this is the worst case of hemorrhoids he has ever had.  He states that he has not had any blood but significant pain.  He started out having diarrhea 10 days ago, which resulted in some irritation.  The diarrhea cleared and went a little into constipation.  He spent Saturday raking leaves and feels this made it worse.  The patient states he feels definite swelling.  Patient also complains that he has had some sinus issues for 2 weeks.  He has had congestion, pressure and headache.  He states that he overall just does not feel well.  Patient Active Problem List   Diagnosis Date Noted  . AB (asthmatic bronchitis) 11/30/2014  . Clinical depression 11/30/2014  . GERD (gastroesophageal reflux disease) 11/30/2014  . HLD (hyperlipidemia) 11/30/2014  . Blood glucose elevated 11/30/2014  . Cannot sleep 11/30/2014  . Hypotestosteronism 11/30/2014  . Lumbar radiculopathy 11/30/2014  . 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 Obstructive apnea 11/30/2014  . Adjustment disorder with anxiety 05/05/2007  . Allergic rhinitis 11/25/2006    No past medical history on file.  Social History   Social History  . Marital status: Divorced    Spouse name: N/A  . Number of children: N/A  . Years of education: N/A   Occupational History  . Not on file.   Social History Main Topics  . Smoking status: Never Smoker  . Smokeless tobacco: Never Used  . Alcohol use No  . Drug use: No  . Sexual activity: Not on file   Other Topics Concern  . Not on file   Social History Narrative  . No narrative  on file    Outpatient Encounter Prescriptions as of 01/20/2016  Medication Sig Note  . albuterol (PROAIR HFA) 108 (90 BASE) MCG/ACT inhaler Inhale into the lungs. 11/30/2014: Medication taken as needed.  Received from: [facility]  . aspirin 81 MG tablet Take by mouth. 11/30/2014: Received from: [facility]  . citalopram (CELEXA) 20 MG tablet TAKE 2 TABLETS BY MOUTH EVERY DAY   . fexofenadine (ALLEGRA ALLERGY) 180 MG tablet Take by mouth. 11/30/2014: Medication taken as needed.  Received from: [facility]  . finasteride (PROSCAR) 5 MG tablet TAKE ONE TABLET BY MOUTH EVERY DAY   . Multiple Vitamin tablet Take by mouth. 11/30/2014: Received from: [facility]  . omeprazole (PRILOSEC) 20 MG capsule TAKE ONE CAPSULE BY MOUTH EVERY DAY   . [DISCONTINUED] B COMPLEX VITAMINS PO Take by mouth. 11/30/2014: Received from: [facility]  . [DISCONTINUED] cholecalciferol (VITAMIN D) 1000 UNITS tablet Take by mouth. 11/30/2014: Received from: [facility]  . [DISCONTINUED] hydrocortisone (ANUSOL-HC) 25 MG suppository Place 1 suppository (25 mg total) rectally 2 (two) times daily.    No facility-administered encounter medications on file as of 01/20/2016.     Allergies  Allergen Reactions  . Promethazine     Review of Systems  Constitutional: Positive for malaise/fatigue. Negative for chills and fever.  HENT: Positive for congestion and sinus pain. Negative for  ear discharge, ear pain, hearing loss, nosebleeds and tinnitus. Sore throat: from drainage a few weeks ago.   Eyes: Negative for blurred vision, double vision, photophobia, pain, discharge and redness.  Respiratory: Positive for cough, sputum production and shortness of breath. Negative for wheezing.   Cardiovascular: Negative for chest pain, palpitations, orthopnea, claudication, leg swelling and PND.  Gastrointestinal: Positive for  constipation and diarrhea. Negative for abdominal pain, blood in stool, heartburn, melena, nausea and vomiting.  Neurological: Positive for weakness.  Endo/Heme/Allergies: Negative.   Psychiatric/Behavioral: Negative.     Objective:  BP 132/80 (BP Location: Right Arm, Patient Position: Sitting, Cuff Size: Normal)   Pulse 88   Temp 99.1 F (37.3 C) (Oral)   Physical Exam  Constitutional: He is oriented to person, place, and time and well-developed, well-nourished, and in no distress.  HENT:  Head: Normocephalic and atraumatic.  Right Ear: External ear normal.  Left Ear: External ear normal.  Nose: Nose normal.  Mouth/Throat: Oropharynx is clear and moist.  Eyes: Conjunctivae are normal. No scleral icterus.  Neck: No thyromegaly present.  Cardiovascular: Normal rate, regular rhythm and normal heart sounds.   Pulmonary/Chest: Effort normal and breath sounds normal.  Abdominal: Soft.  Genitourinary:  Genitourinary Comments: No hemorrhoids noted. There is swelling and induration and tenderness in the left inferior/posterior perianal area.  Lymphadenopathy:    He has no cervical adenopathy.  Neurological: He is alert and oriented to person, place, and time.  Skin: Skin is warm and dry.  Psychiatric: Mood, memory, affect and judgment normal.    Assessment and Plan :   1. Abscess, perianal Dr. Tollie Pizza has kindly agreed to see the patient immediately. - amoxicillin-clavulanate (AUGMENTIN) 875-125 MG tablet; Take 1 tablet by mouth 2 (two) times daily.  Dispense: 20 tablet; Refill: 0  2. Frontal sinusitis, unspecified chronicity  - amoxicillin-clavulanate (AUGMENTIN) 875-125 MG tablet; Take 1 tablet by mouth 2 (two) times daily.  Dispense: 20 tablet; Refill: 0 3. Mild obesity Diet and exercise discussed with patient  I have done the exam and reviewed the chart and it is accurate to the best of my knowledge. Development worker, community has been used and  any errors in dictation or  transcription are unintentional. Miguel Aschoff M.D. [facility]

## 2016-01-20 NOTE — Progress Notes (Signed)
Patient ID: GARLAN ALBARES, male   DOB: 1960-07-13, 55 y.o.   MRN: [id number]  Chief Complaint  Patient presents with  . Other    HPI JAMARRION LONGAKER is a 55 y.o. male here today for a evaluation of a perianal abscess. He saw Dr. Rosanna Randy back in August for hemorrhoids. Patient states over the last 10 days or so he has had another flare up and admitted that this is the worst case of hemorrhoids he has ever had.  HPI  Past Medical History:  Diagnosis Date  . Allergy     Past Surgical History:  Procedure Laterality Date  . APPENDECTOMY      Family History  Problem Relation Age of Onset  . Alzheimer's disease Father     Social History Social History  Substance Use Topics  . Smoking status: Never Smoker  . Smokeless tobacco: Never Used  . Alcohol use No    Allergies  Allergen Reactions  . Promethazine     Current Outpatient Prescriptions  Medication Sig Dispense Refill  . albuterol (PROAIR HFA) 108 (90 BASE) MCG/ACT inhaler Inhale into the lungs.    [name] aspirin 81 MG tablet Take by mouth.    . citalopram (CELEXA) 20 MG tablet TAKE 2 TABLETS BY MOUTH EVERY DAY 180 tablet 3  . fexofenadine (ALLEGRA ALLERGY) 180 MG tablet Take by mouth.    . finasteride (PROSCAR) 5 MG tablet TAKE ONE TABLET BY MOUTH EVERY DAY 30 tablet 12  . Multiple Vitamin tablet Take by mouth.    [name] omeprazole (PRILOSEC) 20 MG capsule TAKE ONE CAPSULE BY MOUTH EVERY DAY 30 capsule 12  . amoxicillin-clavulanate (AUGMENTIN) 875-125 MG tablet Take 1 tablet by mouth 2 (two) times daily. 20 tablet 0   No current facility-administered medications for this visit.     Review of Systems Review of Systems  Constitutional: Negative.   Respiratory: Negative.   Cardiovascular: Negative.     Blood pressure 134/72, pulse 76, resp. rate 12, height 6' (1.829 m), weight 237 lb (107.5 kg).  Physical Exam Physical Exam  Genitourinary:         Assessment    Perianal abscess.    Plan    Indication for  incision and drainage reviewed. The patient was amenable to proceed. 10 mL of 0.5% Xylocaine with 0.25% Marcaine with 1-200,000 epinephrine was utilized well tolerated. This was supplemented with 3 mL of 1% Xylocaine with 1-100,000 epinephrine. Betadine was applied the area. A radial incision was made and approximately 20-30 mL of thick, odorless purulent fluid obtained. Culture obtained. Some evidence of extension towards the anus. Dry dressing applied. The patient will make use of the Augmentin prescription provided by Dr. Rosanna Randy. Warm compresses or soaks recommended. Arrangements will made for follow-up. In one week. Narcotic analgesics declined by the patient.   Return in one week  This information has been scribed by Gaspar Cola CMA.   Robert Bellow 01/20/2016, 7:50 PM

## 2016-01-24 LAB — ANAEROBIC AND AEROBIC CULTURE

## 2016-01-28 ENCOUNTER — Ambulatory Visit (INDEPENDENT_AMBULATORY_CARE_PROVIDER_SITE_OTHER): Payer: 59 | Admitting: General Surgery

## 2016-01-28 ENCOUNTER — Encounter: Payer: Self-pay | Admitting: General Surgery

## 2016-01-28 VITALS — BP 136/82 | HR 78 | Resp 12 | Ht 72.0 in | Wt 236.0 lb

## 2016-01-28 DIAGNOSIS — K610 Anal abscess: Secondary | ICD-10-CM

## 2016-01-28 NOTE — Progress Notes (Signed)
Patient ID: DRYDEN JARROW, male   DOB: May 07, 1960, 55 y.o.   MRN: [id number]  Chief Complaint  Patient presents with  . Follow-up    HPI JOSIAHS SEKHON is a 55 y.o. male here today for his one week follow up perianal abscess which was drained on 01/20/2016. He states the area stopped draining on Thursday. He stats it feels much better.  HPI  Past Medical History:  Diagnosis Date  . Allergy     Past Surgical History:  Procedure Laterality Date  . APPENDECTOMY    . COLONOSCOPY  2016   Dr [name]    Family History  Problem Relation Age of Onset  . Alzheimer's disease Father     Social History Social History  Substance Use Topics  . Smoking status: Never Smoker  . Smokeless tobacco: Never Used  . Alcohol use No    Allergies  Allergen Reactions  . Promethazine     Current Outpatient Prescriptions  Medication Sig Dispense Refill  . albuterol (PROAIR HFA) 108 (90 BASE) MCG/ACT inhaler Inhale into the lungs.    [name] amoxicillin-clavulanate (AUGMENTIN) 875-125 MG tablet Take 1 tablet by mouth 2 (two) times daily. 20 tablet 0  . aspirin 81 MG tablet Take by mouth.    . citalopram (CELEXA) 20 MG tablet TAKE 2 TABLETS BY MOUTH EVERY DAY 180 tablet 3  . fexofenadine (ALLEGRA ALLERGY) 180 MG tablet Take by mouth.    . finasteride (PROSCAR) 5 MG tablet TAKE ONE TABLET BY MOUTH EVERY DAY 30 tablet 12  . Multiple Vitamin tablet Take by mouth.    [name] omeprazole (PRILOSEC) 20 MG capsule TAKE ONE CAPSULE BY MOUTH EVERY DAY 30 capsule 12   No current facility-administered medications for this visit.     Review of Systems Review of Systems  Constitutional: Negative.   Respiratory: Negative.   Cardiovascular: Negative.     Blood pressure 136/82, pulse 78, resp. rate 12, height 6' (1.829 m), weight 236 lb (107 kg).  Physical Exam Physical Exam  Constitutional: He is oriented to person, place, and time. He appears well-developed and well-nourished.  Genitourinary:      Genitourinary Comments: Perianal area healed well.  Neurological: He is alert and oriented to person, place, and time.  Skin: Skin is warm and dry.  Psychiatric: His behavior is normal.    Data Reviewed Culture grew strep viridans.  Assessment    Resolution of perianal abscess.    Plan    The patient has been asked to call the office directly if he develops recurrent episodes of pain or swelling.    Follow up as needed. The patient is aware to call back for any questions or concerns.   This information has been scribed by Karie Fetch RN, BSN,BC. [name]    Robert Bellow 01/28/2016, 6:29 PM

## 2016-01-28 NOTE — Patient Instructions (Signed)
The patient is aware to call back for any questions or concerns.  

## 2016-05-10 ENCOUNTER — Other Ambulatory Visit: Payer: Self-pay | Admitting: Family Medicine

## 2016-11-08 ENCOUNTER — Other Ambulatory Visit: Payer: Self-pay | Admitting: Family Medicine

## 2016-12-18 DIAGNOSIS — Z23 Encounter for immunization: Secondary | ICD-10-CM | POA: Diagnosis not present

## 2017-01-04 ENCOUNTER — Other Ambulatory Visit: Payer: Self-pay | Admitting: Family Medicine

## 2017-01-20 ENCOUNTER — Other Ambulatory Visit: Payer: Self-pay | Admitting: Family Medicine

## 2017-01-20 ENCOUNTER — Ambulatory Visit: Payer: 59 | Admitting: Family Medicine

## 2017-01-20 ENCOUNTER — Encounter: Payer: Self-pay | Admitting: Family Medicine

## 2017-01-20 VITALS — BP 168/92 | HR 93 | Temp 99.2°F | Resp 18 | Wt 240.6 lb

## 2017-01-20 DIAGNOSIS — J069 Acute upper respiratory infection, unspecified: Secondary | ICD-10-CM

## 2017-01-20 NOTE — Patient Instructions (Signed)
Discussed use of saline nasal spray, Delsym for cough, and Benadryl at night for post nasal drainage. If necessary may use a nasal decongestant spray for 3 days in lieu of oral decongestants.

## 2017-01-20 NOTE — Progress Notes (Signed)
Subjective:     Patient ID: Drew Lopez, male   DOB: Dec 14, 1960, 56 y.o.   MRN: [id number] Chief Complaint  Patient presents with  . cough & congestion    Patient presents today with compliants of cough, congestion, hoarseness, and sore throat. He reports symptoms started on Sunday, and gradually worsening. He has been taking decongestants, advil and tylenol for symptoms with mild relief.   . Sore Throat   HPI Reports his son is sick as well.  Review of Systems     Objective:   Physical Exam  Constitutional: He appears well-developed and well-nourished. No distress.  Ears: T.M's intact without inflammation Throat: tonsils absent; posterior pharyngeal erythema. Neck: no cervical adenopathy Lungs: clear     Assessment:    1. Viral upper respiratory tract infection    Plan:    Stop oral decongestants due to elevated blood pressure. Discusses otc medication and natural history of his illness.

## 2017-01-28 ENCOUNTER — Other Ambulatory Visit: Payer: Self-pay

## 2017-01-28 DIAGNOSIS — J988 Other specified respiratory disorders: Secondary | ICD-10-CM

## 2017-01-28 MED ORDER — AZITHROMYCIN 250 MG PO TABS: ORAL_TABLET | ORAL | 0 refills | Status: DC

## 2017-02-07 ENCOUNTER — Other Ambulatory Visit: Payer: Self-pay | Admitting: Family Medicine

## 2017-02-10 ENCOUNTER — Other Ambulatory Visit: Payer: Self-pay | Admitting: Family Medicine

## 2017-02-10 MED ORDER — CITALOPRAM HYDROBROMIDE 20 MG PO TABS: 40.0000 mg | ORAL_TABLET | Freq: Every day | ORAL | 0 refills | Status: DC

## 2017-02-10 NOTE — Telephone Encounter (Signed)
Also spoke with pharmacist at [facility] they did not receive RX on 01/04/17 with 90 day supply and refills.-Sequoia Mincey V Averiana Clouatre, RMA

## 2017-02-10 NOTE — Telephone Encounter (Signed)
No answer, will send in a refill but need an appointment for any more refills-Drew Lopez V Chayse Gracey, RMA

## 2017-02-10 NOTE — Telephone Encounter (Signed)
Pt is out and needs refill asap

## 2017-02-10 NOTE — Telephone Encounter (Signed)
Pt needs refill Citalopram 20 mg  [facility]  He needs a new refill  Thanks teri

## 2017-02-10 NOTE — Telephone Encounter (Signed)
Pt advised and he will call back to make an appt-Drew Lopez [employer], RMA

## 2017-04-12 ENCOUNTER — Other Ambulatory Visit: Payer: Self-pay

## 2017-04-12 ENCOUNTER — Ambulatory Visit: Payer: 59 | Admitting: Family Medicine

## 2017-04-12 VITALS — BP 126/80 | HR 90 | Temp 98.3°F | Resp 16 | Wt 243.0 lb

## 2017-04-12 DIAGNOSIS — F4322 Adjustment disorder with anxiety: Secondary | ICD-10-CM

## 2017-04-12 DIAGNOSIS — E349 Endocrine disorder, unspecified: Secondary | ICD-10-CM

## 2017-04-12 DIAGNOSIS — G4733 Obstructive sleep apnea (adult) (pediatric): Secondary | ICD-10-CM

## 2017-04-12 MED ORDER — CITALOPRAM HYDROBROMIDE 20 MG PO TABS: 40.0000 mg | ORAL_TABLET | Freq: Every day | ORAL | 0 refills | Status: DC

## 2017-04-12 NOTE — Progress Notes (Signed)
Drew Lopez  MRN: [id number] DOB: Jan 20, 1961  Subjective:  HPI   The patient is a 57 year old male who presents for follow up of depression.  He was last seen for an acute visit in December.   He feels his medicine is doing well but he is very frustrated with life.  Patient Active Problem List   Diagnosis Date Noted  . AB (asthmatic bronchitis) 11/30/2014  . Clinical depression 11/30/2014  . GERD (gastroesophageal reflux disease) 11/30/2014  . HLD (hyperlipidemia) 11/30/2014  . Blood glucose elevated 11/30/2014  . Cannot sleep 11/30/2014  . Hypotestosteronism 11/30/2014  . Lumbar radiculopathy 11/30/2014  . 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 Obstructive apnea 11/30/2014  . Adjustment disorder with anxiety 05/05/2007  . Allergic rhinitis 11/25/2006    Past Medical History:  Diagnosis Date  . Allergy     Social History   Socioeconomic History  . Marital status: Divorced    Spouse name: Not on file  . Number of children: Not on file  . Years of education: Not on file  . Highest education level: Not on file  Social Needs  . Financial resource strain: Not on file  . Food insecurity - worry: Not on file  . Food insecurity - inability: Not on file  . Transportation needs - medical: Not on file  . Transportation needs - non-medical: Not on file  Occupational History  . Not on file  Tobacco Use  . Smoking status: Never Smoker  . Smokeless tobacco: Never Used  Substance and Sexual Activity  . Alcohol use: No  . Drug use: No  . Sexual activity: Not on file  Other Topics Concern  . Not on file  Social History Narrative  . Not on file    Outpatient Encounter Medications as of 04/12/2017  Medication Sig  . albuterol (PROAIR HFA) 108 (90 BASE) MCG/ACT inhaler Inhale into the lungs.  [name] aspirin 81 MG tablet Take by mouth.  . citalopram (CELEXA) 20 MG tablet Take 2 tablets (40 mg total) by mouth daily.  . fexofenadine (ALLEGRA ALLERGY) 180 MG tablet Take by mouth.  .  finasteride (PROSCAR) 5 MG tablet TAKE ONE TABLET BY MOUTH EVERY DAY  . Multiple Vitamin tablet Take by mouth.  [name] omeprazole (PRILOSEC) 20 MG capsule TAKE ONE CAPSULE BY MOUTH EVERY DAY  . [DISCONTINUED] azithromycin (ZITHROMAX) 250 MG tablet Take 2 tabs day 1 and then 1 daily day 2-5   No facility-administered encounter medications on file as of 04/12/2017.     Allergies  Allergen Reactions  . Promethazine     Review of Systems  Constitutional: Positive for malaise/fatigue.  Eyes: Negative.   Respiratory: Negative for cough, shortness of breath and wheezing.   Cardiovascular: Negative for chest pain and palpitations.  Gastrointestinal: Negative.   Skin: Negative.   Neurological: Positive for dizziness (vertigo).  Endo/Heme/Allergies: Negative.   Psychiatric/Behavioral: Positive for depression. Negative for hallucinations, substance abuse and suicidal ideas.    Objective:  BP 126/80 (BP Location: Right Arm, Patient Position: Sitting, Cuff Size: Normal)   Pulse 90   Temp 98.3 F (36.8 C) (Oral)   Resp 16   Wt 243 lb (110.2 kg)   BMI 32.96 kg/m   Physical Exam  Constitutional: He is oriented to person, place, and time and well-developed, well-nourished, and in no distress.  HENT:  Head: Normocephalic and atraumatic.  Eyes: Conjunctivae are normal.  Neck: No thyromegaly present.  Cardiovascular: Normal rate, regular rhythm and normal heart sounds.  Pulmonary/Chest: Effort normal and breath sounds normal.  Abdominal: Soft.  Neurological: He is alert and oriented to person, place, and time. Gait normal. GCS score is 15.  Skin: Skin is warm and dry.  Psychiatric: Mood, affect and judgment normal.    Assessment and Plan :  1. Adjustment disorder with anxiety Working 6-7 days per week. Discussed trying 4-5 days of exercise per week. - citalopram (CELEXA) 20 MG tablet; Take 2 tablets (40 mg total) by mouth daily.  Dispense: 180 tablet; Refill: 0 2.Mild Obesity  I have done  the exam and reviewed the chart and it is accurate to the best of my knowledge. Development worker, community has been used and  any errors in dictation or transcription are unintentional. Miguel Aschoff M.D. [facility]

## 2017-05-10 ENCOUNTER — Other Ambulatory Visit: Payer: Self-pay | Admitting: Family Medicine

## 2017-05-25 ENCOUNTER — Encounter: Payer: Self-pay | Admitting: General Surgery

## 2017-05-25 ENCOUNTER — Ambulatory Visit: Payer: 59 | Admitting: General Surgery

## 2017-05-25 VITALS — BP 160/84 | HR 89 | Ht 72.0 in | Wt 242.0 lb

## 2017-05-25 DIAGNOSIS — K603 Anal fistula: Secondary | ICD-10-CM

## 2017-05-25 NOTE — Progress Notes (Signed)
Patient ID: Drew Lopez, male   DOB: [birth year], 57 y.o.   MRN: [id number]  Chief Complaint  Patient presents with  . Other    HPI Drew Lopez is a 57 y.o. male here today for a evaluation of a anal abscess. Patient states he noticed this area about a week ago. The area was red swollen and painful to sit. In 2017,he had the area drained. He has had flare ups off and on. Last night he states the area drained. He states the area drained lots of blood and clean fluids. He states yesterday he was feeling clammy.  Symptoms past with spontaneous drainage from the abscess. In the last 2 days he did notice some swelling and tenderness in the upper inner aspect of the left groin. HPI  Past Medical History:  Diagnosis Date  . Allergy     Past Surgical History:  Procedure Laterality Date  . APPENDECTOMY    . COLONOSCOPY  2016   Dr [name]  . HERNIA REPAIR Bilateral    10    Family History  Problem Relation Age of Onset  . Alzheimer's disease Father     Social History Social History   Tobacco Use  . Smoking status: Never Smoker  . Smokeless tobacco: Never Used  Substance Use Topics  . Alcohol use: No  . Drug use: No    Allergies  Allergen Reactions  . Promethazine     Current Outpatient Medications  Medication Sig Dispense Refill  . aspirin 81 MG tablet Take by mouth.    . citalopram (CELEXA) 20 MG tablet Take 2 tablets (40 mg total) by mouth daily. 180 tablet 0  . fexofenadine (ALLEGRA ALLERGY) 180 MG tablet Take by mouth.    . finasteride (PROSCAR) 5 MG tablet TAKE ONE TABLET BY MOUTH EVERY DAY 30 tablet 11  . Multiple Vitamin tablet Take by mouth.    [name] omeprazole (PRILOSEC) 20 MG capsule TAKE 1 CAPSULE BY MOUTH EVERY DAY 90 capsule 3   No current facility-administered medications for this visit.     Review of Systems Review of Systems  Constitutional: Negative.   Respiratory: Negative.   Cardiovascular: Negative.     Blood pressure (!) 160/84, pulse 89,  height 6' (1.829 m), weight 242 lb (109.8 kg), SpO2 98 %.  Physical Exam Physical Exam  Constitutional: He is oriented to person, place, and time. He appears well-developed and well-nourished.  Eyes: Conjunctivae are normal. No scleral icterus.  Neck: Neck supple.  Cardiovascular: Normal rate, regular rhythm and normal heart sounds.  Pulmonary/Chest: Effort normal and breath sounds normal.  Genitourinary:     Genitourinary Comments: Left face perianal abscess.Left inguinal swelling.   Lymphadenopathy:    He has no cervical adenopathy.       Left: Inguinal adenopathy present.  No lymphangitic streaking.  Neurological: He is alert and oriented to person, place, and time.  Skin: Skin is warm and dry.       Assessment    Recurrent perirectal abscess, likely anal fistula.    Plan Occasions for fistulotomy reviewed.  Potential for dysfunction of the anal sphincter discussed, although very uncommon. Patient to have a Anal fistula.  The patient is aware to use a heating pad as needed for comfort. The patient is aware to call back for any questions or concerns.   HPI, Physical Exam, Assessment and Plan have been scribed under the direction and in the presence of Hervey Ard, MD.  Gaspar Cola, CMA  I  have completed the exam and reviewed the above documentation for accuracy and completeness.  I agree with the above.  Haematologist has been used and any errors in dictation or transcription are unintentional.  Hervey Ard, M.D., F.A.C.S.  [name] Jonmarc Bodkin 05/26/2017, 4:40 PM

## 2017-05-25 NOTE — Patient Instructions (Addendum)
Patient to have a Anal fistula.  The patient is aware to use a heating pad as needed for comfort. The patient is aware to call back for any questions or concerns.

## 2017-05-26 DIAGNOSIS — K603 Anal fistula: Secondary | ICD-10-CM | POA: Insufficient documentation

## 2017-05-27 ENCOUNTER — Telehealth: Payer: Self-pay | Admitting: *Deleted

## 2017-05-27 NOTE — Telephone Encounter (Signed)
Patient called the office to let us know that he is ready to proceed with anal fistulotomy.   Patient's surgery has been scheduled for 06-23-17 at [facility] with Dr. [name].   The patient is aware to contact the office if he should have further questions. He verbalizes understanding.

## 2017-06-01 ENCOUNTER — Ambulatory Visit: Payer: 59 | Admitting: General Surgery

## 2017-06-15 ENCOUNTER — Telehealth: Payer: Self-pay | Admitting: Family Medicine

## 2017-06-15 NOTE — Telephone Encounter (Signed)
Juliann Pulse at [facility] called for Drew Lopez saying he is going to have some minor surgery with Dr. [name] next week and would like a copy of all his labs and any test results he has had recently  She ask if you could fax to [facility] [phone]   If there are any questions please call call Juliann Pulse at [phone]  Thanks teri

## 2017-06-15 NOTE — Telephone Encounter (Signed)
Printed

## 2017-06-15 NOTE — Telephone Encounter (Signed)
Faxed

## 2017-06-16 ENCOUNTER — Inpatient Hospital Stay: Admission: RE | Admit: 2017-06-16 | Payer: Self-pay | Source: Ambulatory Visit

## 2017-06-17 ENCOUNTER — Inpatient Hospital Stay: Admission: RE | Admit: 2017-06-17 | Payer: Self-pay | Source: Ambulatory Visit

## 2017-06-21 ENCOUNTER — Other Ambulatory Visit: Payer: Self-pay

## 2017-06-21 ENCOUNTER — Encounter
Admission: RE | Admit: 2017-06-21 | Discharge: 2017-06-21 | Disposition: A | Discharge status: Home / Self Care | Payer: Commercial Managed Care - HMO | Source: Ambulatory Visit | Attending: General Surgery | Admitting: General Surgery

## 2017-06-21 HISTORY — DX: Unspecified osteoarthritis, unspecified site: M19.90

## 2017-06-21 HISTORY — DX: Personal history of urinary calculi: Z87.442

## 2017-06-21 HISTORY — DX: Adverse effect of unspecified anesthetic, initial encounter: T41.45XA

## 2017-06-21 HISTORY — DX: Low back pain: M54.5

## 2017-06-21 HISTORY — DX: Other complications of anesthesia, initial encounter: T88.59XA

## 2017-06-21 HISTORY — DX: Malignant (primary) neoplasm, unspecified: C80.1

## 2017-06-21 HISTORY — DX: Nausea with vomiting, unspecified: R11.2

## 2017-06-21 HISTORY — DX: Anxiety disorder, unspecified: F41.9

## 2017-06-21 HISTORY — DX: Gastro-esophageal reflux disease without esophagitis: K21.9

## 2017-06-21 HISTORY — DX: Other specified postprocedural states: Z98.890

## 2017-06-21 HISTORY — DX: Dizziness and giddiness: R42

## 2017-06-21 HISTORY — DX: Low back pain, unspecified: M54.50

## 2017-06-21 HISTORY — DX: Sleep apnea, unspecified: G47.30

## 2017-06-21 NOTE — Patient Instructions (Addendum)
Your procedure is scheduled on: 06-23-17 Report to Same Day Surgery 2nd floor medical mall [facility] Entrance-take elevator on left to 2nd floor.  Check in with surgery information desk.) To find out your arrival time please call [phone] between 1PM - 3PM on 06-22-17  Remember: Instructions that are not followed completely may result in serious medical risk, up to and including death, or upon the discretion of your surgeon and anesthesiologist your surgery may need to be rescheduled.    _x___ 1. Do not eat food after midnight the night before your procedure. NO GUM OR CANDY AFTER MIDNIGHT. You may drink clear liquids up to 2 hours before you are scheduled to arrive at the hospital for your procedure.  Do not drink clear liquids within 2 hours of your scheduled arrival to the hospital.  Clear liquids include  --Water or Apple juice without pulp  --Clear carbohydrate beverage such as ClearFast or Gatorade  --Black Coffee or Clear Tea (No milk, no creamers, do not add anything to the coffee or Tea    __x__ 2. No Alcohol for 24 hours before or after surgery.   __x__3. No Smoking or e-cigarettes for 24 prior to surgery.  Do not use any chewable tobacco products for at least 6 hour prior to surgery   ____  4. Bring all medications with you on the day of surgery if instructed.    __x__ 5. Notify your doctor if there is any change in your medical condition     (cold, fever, infections).    x___6. On the morning of surgery brush your teeth with toothpaste and water.  You may rinse your mouth with mouth wash if you wish.  Do not swallow any toothpaste or mouthwash.   Do not wear jewelry, make-up, hairpins, clips or nail polish.  Do not wear lotions, powders, or perfumes. You may wear deodorant.  Do not shave 48 hours prior to surgery. Men may shave face and neck.  Do not bring valuables to the hospital.    [facility] is not responsible for any belongings or valuables.    Contacts, dentures or bridgework may not be worn into surgery.  Leave your suitcase in the car. After surgery it may be brought to your room.  For patients admitted to the hospital, discharge time is determined by your treatment team.  _  Patients discharged the day of surgery will not be allowed to drive home.  You will need someone to drive you home and stay with you the night of your procedure.     _x___ TAKE THE FOLLOWING MEDICATION THE MORNING OF SURGERY. These include:  1. PROSCAR  2. PRILOSEC  3.  4.  5.  6.  ____Fleets enema or Magnesium Citrate as directed.   ____ Use CHG Soap or sage wipes as directed on instruction sheet   ____ Use inhalers on the day of surgery and bring to hospital day of surgery  ____ Stop Metformin and Janumet 2 days prior to surgery.    ____ Take 1/2 of usual insulin dose the night before surgery and none on the morning surgery.   _X___ Follow recommendations from Cardiologist, Pulmonologist or PCP regarding stopping Aspirin, Coumadin, Plavix ,Eliquis, Effient, or Pradaxa, and Pletal-OK TO CONTINUE 81 MG ASA-DO NOT TAKE AM OF SURGERY  X____Stop Anti-inflammatories such as Advil, Aleve, Ibuprofen, Motrin, Naproxen, Naprosyn, Goodies powders or aspirin products. OK to take Tylenol    ____ Stop supplements until after surgery.    _X___  Bring C-Pap to the hospital.

## 2017-06-22 ENCOUNTER — Encounter: Payer: Self-pay | Admitting: *Deleted

## 2017-06-22 MED ORDER — SODIUM CHLORIDE 0.9 % IV SOLN
1.0000 g | INTRAVENOUS | Status: AC
  Administered 2017-06-23: 1 g via INTRAVENOUS
  Filled 2017-06-22: qty 1

## 2017-06-23 ENCOUNTER — Ambulatory Visit: Payer: Commercial Managed Care - HMO | Admitting: Registered Nurse

## 2017-06-23 ENCOUNTER — Encounter
Admission: RE | Disposition: A | Discharge status: Home / Self Care | Payer: Self-pay | Source: Ambulatory Visit | Attending: General Surgery

## 2017-06-23 ENCOUNTER — Encounter: Payer: Self-pay | Admitting: *Deleted

## 2017-06-23 ENCOUNTER — Ambulatory Visit
Admission: RE | Admit: 2017-06-23 | Discharge: 2017-06-23 | Disposition: A | Discharge status: Home / Self Care | Payer: Commercial Managed Care - HMO | Source: Ambulatory Visit | Attending: General Surgery | Admitting: General Surgery

## 2017-06-23 ENCOUNTER — Other Ambulatory Visit: Payer: Self-pay

## 2017-06-23 DIAGNOSIS — Z79899 Other long term (current) drug therapy: Secondary | ICD-10-CM | POA: Diagnosis not present

## 2017-06-23 DIAGNOSIS — J45909 Unspecified asthma, uncomplicated: Secondary | ICD-10-CM | POA: Insufficient documentation

## 2017-06-23 DIAGNOSIS — K610 Anal abscess: Secondary | ICD-10-CM | POA: Insufficient documentation

## 2017-06-23 DIAGNOSIS — K219 Gastro-esophageal reflux disease without esophagitis: Secondary | ICD-10-CM | POA: Diagnosis not present

## 2017-06-23 DIAGNOSIS — K603 Anal fistula: Secondary | ICD-10-CM | POA: Diagnosis not present

## 2017-06-23 DIAGNOSIS — K605 Anorectal fistula: Secondary | ICD-10-CM | POA: Diagnosis not present

## 2017-06-23 DIAGNOSIS — Z7982 Long term (current) use of aspirin: Secondary | ICD-10-CM | POA: Insufficient documentation

## 2017-06-23 DIAGNOSIS — G4730 Sleep apnea, unspecified: Secondary | ICD-10-CM | POA: Diagnosis not present

## 2017-06-23 HISTORY — PX: ANAL FISTULOTOMY: SHX6423

## 2017-06-23 SURGERY — ANAL FISTULOTOMY
Anesthesia: General | Wound class: Clean Contaminated

## 2017-06-23 MED ORDER — MIDAZOLAM HCL 2 MG/2ML IJ SOLN
INTRAMUSCULAR | Status: AC
  Filled 2017-06-23: qty 2

## 2017-06-23 MED ORDER — LACTATED RINGERS IV SOLN
INTRAVENOUS | Status: DC
  Administered 2017-06-23: 10:00:00 via INTRAVENOUS

## 2017-06-23 MED ORDER — FENTANYL CITRATE (PF) 100 MCG/2ML IJ SOLN
INTRAMUSCULAR | Status: AC
  Filled 2017-06-23: qty 2

## 2017-06-23 MED ORDER — LIDOCAINE HCL (CARDIAC) PF 100 MG/5ML IV SOSY
PREFILLED_SYRINGE | INTRAVENOUS | Status: DC | PRN
  Administered 2017-06-23: 100 mg via INTRAVENOUS

## 2017-06-23 MED ORDER — GLYCOPYRROLATE 0.2 MG/ML IJ SOLN
INTRAMUSCULAR | Status: AC
  Filled 2017-06-23: qty 1

## 2017-06-23 MED ORDER — ACETAMINOPHEN 10 MG/ML IV SOLN
INTRAVENOUS | Status: DC | PRN
  Administered 2017-06-23: 1000 mg via INTRAVENOUS

## 2017-06-23 MED ORDER — BACITRACIN ZINC 500 UNIT/GM EX OINT
TOPICAL_OINTMENT | CUTANEOUS | Status: AC
  Filled 2017-06-23: qty 28.35

## 2017-06-23 MED ORDER — GLYCOPYRROLATE 0.2 MG/ML IJ SOLN
INTRAMUSCULAR | Status: DC | PRN
  Administered 2017-06-23: 0.2 mg via INTRAVENOUS

## 2017-06-23 MED ORDER — PROPOFOL 10 MG/ML IV BOLUS
INTRAVENOUS | Status: AC
  Filled 2017-06-23: qty 20

## 2017-06-23 MED ORDER — FENTANYL CITRATE (PF) 100 MCG/2ML IJ SOLN
INTRAMUSCULAR | Status: DC | PRN
  Administered 2017-06-23: 50 ug via INTRAVENOUS
  Administered 2017-06-23 (×2): 25 ug via INTRAVENOUS

## 2017-06-23 MED ORDER — HYDROMORPHONE HCL 1 MG/ML IJ SOLN: 0.2500 mg | INTRAMUSCULAR | Status: DC | PRN

## 2017-06-23 MED ORDER — BUPIVACAINE-EPINEPHRINE (PF) 0.5% -1:200000 IJ SOLN
INTRAMUSCULAR | Status: AC
  Filled 2017-06-23: qty 30

## 2017-06-23 MED ORDER — MEPERIDINE HCL 50 MG/ML IJ SOLN: 6.2500 mg | INTRAMUSCULAR | Status: DC | PRN

## 2017-06-23 MED ORDER — DEXAMETHASONE SODIUM PHOSPHATE 10 MG/ML IJ SOLN
INTRAMUSCULAR | Status: AC
  Filled 2017-06-23: qty 1

## 2017-06-23 MED ORDER — BUPIVACAINE-EPINEPHRINE 0.5% -1:200000 IJ SOLN
INTRAMUSCULAR | Status: DC | PRN
  Administered 2017-06-23: 30 mL

## 2017-06-23 MED ORDER — ONDANSETRON HCL 4 MG/2ML IJ SOLN: 4.0000 mg | Freq: Once | INTRAMUSCULAR | Status: DC | PRN

## 2017-06-23 MED ORDER — ACETAMINOPHEN 10 MG/ML IV SOLN
INTRAVENOUS | Status: AC
  Filled 2017-06-23: qty 100

## 2017-06-23 MED ORDER — KETOROLAC TROMETHAMINE 30 MG/ML IJ SOLN
INTRAMUSCULAR | Status: AC
  Administered 2017-06-23: 30 mg via INTRAVENOUS
  Filled 2017-06-23: qty 1

## 2017-06-23 MED ORDER — LIDOCAINE HCL (PF) 2 % IJ SOLN
INTRAMUSCULAR | Status: AC
  Filled 2017-06-23: qty 10

## 2017-06-23 MED ORDER — DEXAMETHASONE SODIUM PHOSPHATE 10 MG/ML IJ SOLN
INTRAMUSCULAR | Status: DC | PRN
  Administered 2017-06-23: 10 mg via INTRAVENOUS

## 2017-06-23 MED ORDER — KETOROLAC TROMETHAMINE 30 MG/ML IJ SOLN
30.0000 mg | Freq: Once | INTRAMUSCULAR | Status: AC | PRN
  Administered 2017-06-23: 30 mg via INTRAVENOUS

## 2017-06-23 MED ORDER — MIDAZOLAM HCL 2 MG/2ML IJ SOLN
INTRAMUSCULAR | Status: DC | PRN
  Administered 2017-06-23: 2 mg via INTRAVENOUS

## 2017-06-23 MED ORDER — ONDANSETRON HCL 4 MG/2ML IJ SOLN
INTRAMUSCULAR | Status: AC
  Filled 2017-06-23: qty 2

## 2017-06-23 MED ORDER — PROPOFOL 10 MG/ML IV BOLUS
INTRAVENOUS | Status: DC | PRN
  Administered 2017-06-23: 50 mg via INTRAVENOUS
  Administered 2017-06-23: 200 mg via INTRAVENOUS

## 2017-06-23 MED ORDER — ONDANSETRON HCL 4 MG/2ML IJ SOLN
INTRAMUSCULAR | Status: DC | PRN
  Administered 2017-06-23: 4 mg via INTRAVENOUS

## 2017-06-23 MED ORDER — HYDROCODONE-ACETAMINOPHEN 5-325 MG PO TABS: 1.0000 | ORAL_TABLET | ORAL | 0 refills | Status: DC | PRN

## 2017-06-23 MED ORDER — HYDROCODONE-ACETAMINOPHEN 7.5-325 MG PO TABS
1.0000 | ORAL_TABLET | Freq: Once | ORAL | Status: DC | PRN
  Filled 2017-06-23: qty 1

## 2017-06-23 SURGICAL SUPPLY — 28 items
BLADE SURG 15 STRL SS SAFETY (BLADE) ×2 IMPLANT
BRIEF STRETCH MATERNITY 2XLG (MISCELLANEOUS) ×2 IMPLANT
CANISTER SUCT 1200ML W/VALVE (MISCELLANEOUS) ×2 IMPLANT
DRAPE LAPAROTOMY 100X77 ABD (DRAPES) ×2 IMPLANT
DRAPE LEGGINS SURG 28X43 STRL (DRAPES) ×2 IMPLANT
DRAPE UNDER BUTTOCK W/FLU (DRAPES) ×2 IMPLANT
DRSG GAUZE PETRO 6X36 STRIP ST (GAUZE/BANDAGES/DRESSINGS) ×2 IMPLANT
ELECT REM PT RETURN 9FT ADLT (ELECTROSURGICAL) ×2
ELECTRODE REM PT RTRN 9FT ADLT (ELECTROSURGICAL) ×1 IMPLANT
GLOVE BIO SURGEON STRL SZ7.5 (GLOVE) ×4 IMPLANT
GLOVE INDICATOR 8.0 STRL GRN (GLOVE) ×4 IMPLANT
GOWN STRL REUS W/ TWL LRG LVL3 (GOWN DISPOSABLE) ×2 IMPLANT
GOWN STRL REUS W/TWL LRG LVL3 (GOWN DISPOSABLE) ×2
KIT TURNOVER KIT A (KITS) ×2 IMPLANT
LABEL OR SOLS (LABEL) ×2 IMPLANT
NEEDLE HYPO 22GX1.5 SAFETY (NEEDLE) ×2 IMPLANT
NEEDLE HYPO 25X1 1.5 SAFETY (NEEDLE) ×2 IMPLANT
NS IRRIG 500ML POUR BTL (IV SOLUTION) ×2 IMPLANT
PACK BASIN MINOR ARMC (MISCELLANEOUS) ×2 IMPLANT
PAD OB MATERNITY 4.3X12.25 (PERSONAL CARE ITEMS) ×2 IMPLANT
PAD PREP 24X41 OB/GYN DISP (PERSONAL CARE ITEMS) ×2 IMPLANT
SOL PREP PVP 2OZ (MISCELLANEOUS) ×2
SOLUTION PREP PVP 2OZ (MISCELLANEOUS) ×1 IMPLANT
SURGILUBE 2OZ TUBE FLIPTOP (MISCELLANEOUS) ×2 IMPLANT
SUT SILK 0 CT 1 30 (SUTURE) ×2 IMPLANT
SUT VIC AB 3-0 SH 27 (SUTURE) ×1
SUT VIC AB 3-0 SH 27X BRD (SUTURE) ×1 IMPLANT
SYR CONTROL 10ML (SYRINGE) ×2 IMPLANT

## 2017-06-23 NOTE — H&P (Signed)
No change in clinical history or exam.  For exam under anesthesia and likely fistulotomy.

## 2017-06-23 NOTE — Op Note (Signed)
Preoperative diagnosis: Recurrent perianal abscess, suspected fistula in anal.  Postoperative diagnosis: Same.  Operative procedure: Exam under anesthesia, anal fistulotomy.  Anesthesia: General by LMA, Marcaine 0.5% with 1 to 200,000 units of epinephrine.  30 cc.  Estimated blood loss: Less than 3 cc.  Clinical note: This 58 year old male previously had a perirectal abscess and since that time is had intermittent episodes of pain and swelling.  On his last visit he had had increasing pain and showed evidence of left inguinal lymphadenopathy.  This is since resolved.  The patient is brought to the operating this time for planned exam under anesthesia and likely fistulotomy.  Patient received antibiotics prior to the procedure.  Operative note: With the patient under adequate general anesthesia he was placed in dorsolithotomy position.  The scrotum was taped out of the way and the perineum cleansed with Betadine solution x2.  The fistula opening at the 1 o'clock position about 3 cm from the anal verge was identified and carefully cannulated with a lacrimal duct probe.  This extended towards the midline.  This area was opened with cutting current and a small thin track of chronic granulation tissue debrided.  Bleeding points controlled with electrocautery.  The site of the chronic fistula opening was excised and discarded.  No other pathology was noted during anoscopy.  Bacitracin ointment was applied to the wound and the dry dressing placed.  The patient tolerated the procedure well and was taken to the recovery room in stable condition.

## 2017-06-23 NOTE — OR Nursing (Signed)
Dr. [name] in to see pt poto @ [phone]

## 2017-06-23 NOTE — Discharge Instructions (Signed)

## 2017-06-23 NOTE — Anesthesia Post-op Follow-up Note (Signed)
Anesthesia QCDR form completed.        

## 2017-06-23 NOTE — Transfer of Care (Signed)
Immediate Anesthesia Transfer of Care Note  Patient: Drew Lopez  Procedure(s) Performed: ANAL FISTULOTOMY (N/A )  Patient Location: PACU  Anesthesia Type:General  Level of Consciousness: sedated  Airway & Oxygen Therapy: Patient Spontanous Breathing and Patient connected to face mask oxygen  Post-op Assessment: Report given to RN and Post -op Vital signs reviewed and stable  Post vital signs: Reviewed  Last Vitals:  Vitals Value Taken Time  BP 128/79 06/23/2017 11:59 AM  Temp 36.4 C 06/23/2017 11:59 AM  Pulse 91 06/23/2017 11:59 AM  Resp 14 06/23/2017 11:59 AM  SpO2 96 % 06/23/2017 11:59 AM  Vitals shown include unvalidated device data.  Last Pain:  Vitals:   06/23/17 1016  TempSrc: Tympanic  PainSc: 0-No pain         Complications: No apparent anesthesia complications

## 2017-06-23 NOTE — Anesthesia Preprocedure Evaluation (Signed)
Anesthesia Evaluation  Patient identified by MRN, date of birth, ID band Patient awake    Reviewed: Allergy & Precautions, H&P , NPO status , reviewed documented beta blocker date and time   History of Anesthesia Complications (+) PONV and history of anesthetic complications  Airway Mallampati: III  TM Distance: >3 FB Neck ROM: full    Dental  (+) Teeth Intact   Pulmonary asthma , sleep apnea and Continuous Positive Airway Pressure Ventilation ,    Pulmonary exam normal        Cardiovascular negative cardio ROS Normal cardiovascular exam     Neuro/Psych PSYCHIATRIC DISORDERS Anxiety Depression  Neuromuscular disease negative neurological ROS  negative psych ROS   GI/Hepatic negative GI ROS, Neg liver ROS, GERD  Medicated and Controlled,  Endo/Other  negative endocrine ROS  Renal/GU      Musculoskeletal  (+) Arthritis ,   Abdominal   Peds  Hematology negative hematology ROS (+)   Anesthesia Other Findings Past Medical History: No date: Allergy No date: Anxiety No date: Arthritis     Comment:  BACK-LOWER No date: Cancer ([place])     Comment:  BASAL CELL No date: Complication of anesthesia No date: GERD (gastroesophageal reflux disease) No date: History of kidney stones     Comment:  h/o No date: Low back pain No date: PONV (postoperative nausea and vomiting) No date: Sleep apnea     Comment:  USES CPAP No date: Vertigo  Past Surgical History: No date: APPENDECTOMY 2016: COLONOSCOPY     Comment:  Dr [name] No date: HERNIA REPAIR; Bilateral     Comment:  10 No date: SHOULDER ARTHROSCOPY  BMI    Body Mass Index:  33.23 kg/m      Reproductive/Obstetrics                             Anesthesia Physical Anesthesia Plan  ASA: II  Anesthesia Plan: General   Post-op Pain Management:    Induction: Intravenous  PONV Risk Score and Plan: 3 and Ondansetron, Dexamethasone,  Midazolam and Metaclopromide  Airway Management Planned: LMA and Oral ETT  Additional Equipment:   Intra-op Plan:   Post-operative Plan: Extubation in OR  Informed Consent: I have reviewed the patients History and Physical, chart, labs and discussed the procedure including the risks, benefits and alternatives for the proposed anesthesia with the patient or authorized representative who has indicated his/her understanding and acceptance.   Dental Advisory Given  Plan Discussed with: CRNA  Anesthesia Plan Comments:         Anesthesia Quick Evaluation

## 2017-06-23 NOTE — Anesthesia Procedure Notes (Signed)
Procedure Name: LMA Insertion Date/Time: 06/23/2017 11:17 AM Performed by: Doreen Salvage, CRNA Pre-anesthesia Checklist: Patient identified, Patient being monitored, Timeout performed, Emergency Drugs available and Suction available Patient Re-evaluated:Patient Re-evaluated prior to induction Oxygen Delivery Method: Circle system utilized Preoxygenation: Pre-oxygenation with 100% oxygen Induction Type: IV induction Ventilation: Mask ventilation without difficulty LMA: LMA inserted LMA Size: 5.0 Tube type: Oral Number of attempts: 1 Placement Confirmation: positive ETCO2 and breath sounds checked- equal and bilateral Tube secured with: Tape Dental Injury: Teeth and Oropharynx as per pre-operative assessment

## 2017-06-24 ENCOUNTER — Encounter: Payer: Self-pay | Admitting: General Surgery

## 2017-06-29 NOTE — Anesthesia Postprocedure Evaluation (Signed)
Anesthesia Post Note  Patient: Drew Lopez  Procedure(s) Performed: ANAL FISTULOTOMY (N/A )  Patient location during evaluation: PACU Anesthesia Type: General Level of consciousness: awake and alert Pain management: pain level controlled Vital Signs Assessment: post-procedure vital signs reviewed and stable Respiratory status: spontaneous breathing, nonlabored ventilation and respiratory function stable Cardiovascular status: blood pressure returned to baseline and stable Postop Assessment: no apparent nausea or vomiting Anesthetic complications: no     Last Vitals:  Vitals:   06/23/17 1254 06/23/17 1346  BP: (!) 146/85 (!) 151/75  Pulse: 91 86  Resp: 18 18  Temp: (!) 36.2 C   SpO2: 95% 97%    Last Pain:  Vitals:   06/23/17 1346  TempSrc:   PainSc: 0-No pain                 [name]

## 2017-07-06 ENCOUNTER — Encounter: Payer: Self-pay | Admitting: General Surgery

## 2017-07-06 ENCOUNTER — Ambulatory Visit (INDEPENDENT_AMBULATORY_CARE_PROVIDER_SITE_OTHER): Payer: 59 | Admitting: General Surgery

## 2017-07-06 VITALS — BP 140/80 | HR 77 | Resp 14 | Ht 72.0 in | Wt 243.0 lb

## 2017-07-06 DIAGNOSIS — K603 Anal fistula: Secondary | ICD-10-CM

## 2017-07-06 NOTE — Patient Instructions (Signed)
The patient is aware to call back for any questions or concerns.  

## 2017-07-06 NOTE — Progress Notes (Signed)
Patient ID: Drew Lopez, male   DOB: [birth year], 57 y.o.   MRN: [id number]  Chief Complaint  Patient presents with  . Routine Post Op    HPI Drew Lopez is a 57 y.o. male.  Here for postoperative visit, anal fistulotomy on 06-23-17. He states he is doing well, sore, no pain. Bowels moving regular. He does admit to a head cold.  HPI  Past Medical History:  Diagnosis Date  . Allergy   . Anxiety   . Arthritis    BACK-LOWER  . Cancer (HCC)    BASAL CELL  . Complication of anesthesia   . GERD (gastroesophageal reflux disease)   . History of kidney stones    h/o  . Low back pain   . PONV (postoperative nausea and vomiting)   . Sleep apnea    USES CPAP  . Vertigo     Past Surgical History:  Procedure Laterality Date  . ANAL FISTULOTOMY N/A 06/23/2017   Procedure: ANAL FISTULOTOMY;  Surgeon: Robert Bellow, MD;  Location: [facility] ORS;  Service: General;  Laterality: N/A;  . APPENDECTOMY    . COLONOSCOPY  2016   Dr [name]  . HERNIA REPAIR Bilateral    10  . SHOULDER ARTHROSCOPY      Family History  Problem Relation Age of Onset  . Alzheimer's disease Father     Social History Social History   Tobacco Use  . Smoking status: Never Smoker  . Smokeless tobacco: Never Used  Substance Use Topics  . Alcohol use: No  . Drug use: No    Allergies  Allergen Reactions  . Promethazine Other (See Comments)    Makes pt VERY JITTERY    Current Outpatient Medications  Medication Sig Dispense Refill  . aspirin 81 MG tablet Take 81 mg by mouth daily.     . citalopram (CELEXA) 20 MG tablet Take 2 tablets (40 mg total) by mouth daily. (Patient taking differently: Take 40 mg by mouth every evening. ) 180 tablet 0  . fexofenadine (ALLEGRA ALLERGY) 180 MG tablet Take 180 mg by mouth at bedtime.     . finasteride (PROSCAR) 5 MG tablet TAKE ONE TABLET BY MOUTH EVERY DAY (Patient taking differently: TAKE ONE TABLET BY MOUTH EVERY DAY-AM) 30 tablet 11  . Multiple Vitamin tablet  Take 1 tablet by mouth daily.     [name] omeprazole (PRILOSEC) 20 MG capsule TAKE 1 CAPSULE BY MOUTH EVERY DAY (Patient taking differently: TAKE 1 CAPSULE BY MOUTH EVERY DAY-PM) 90 capsule 3   No current facility-administered medications for this visit.     Review of Systems Review of Systems  Constitutional: Negative.   Respiratory: Positive for cough.   Cardiovascular: Negative.     Blood pressure 140/80, pulse 77, resp. rate 14, height 6' (1.829 m), weight 243 lb (110.2 kg).  Physical Exam Physical Exam  Constitutional: He is oriented to person, place, and time. He appears well-developed and well-nourished.  Genitourinary:     Genitourinary Comments: Clean wound, silver nitrate applicaiton  Neurological: He is alert and oriented to person, place, and time.  Skin: Skin is warm and dry.  Psychiatric: His behavior is normal.       Assessment    Doing well post fistulotomy.  No difficulty with bowel control of liquid, solid stool, flatus.    Plan    Follow up in 2 weeks. Resume activities as tolerated.      HPI, Physical Exam, Assessment and Plan have been scribed  under the direction and in the presence of Robert Bellow, MD. Karie Fetch, RN  I have completed the exam and reviewed the above documentation for accuracy and completeness.  I agree with the above.  Haematologist has been used and any errors in dictation or transcription are unintentional.  Hervey Ard, M.D., F.A.C.S.  [name] Byrnett 07/06/2017, 9:26 PM

## 2017-07-15 ENCOUNTER — Encounter: Payer: Self-pay | Admitting: Family Medicine

## 2017-07-20 ENCOUNTER — Ambulatory Visit (INDEPENDENT_AMBULATORY_CARE_PROVIDER_SITE_OTHER): Payer: 59 | Admitting: General Surgery

## 2017-07-20 ENCOUNTER — Encounter: Payer: Self-pay | Admitting: General Surgery

## 2017-07-20 VITALS — BP 132/76 | HR 74 | Resp 14 | Ht 72.0 in | Wt 236.0 lb

## 2017-07-20 DIAGNOSIS — K603 Anal fistula: Secondary | ICD-10-CM

## 2017-07-20 NOTE — Progress Notes (Signed)
Patient ID: Drew Lopez, male   DOB: [birth year], 57 y.o.   MRN: [id number]  Chief Complaint  Patient presents with  . Routine Post Op    HPI Drew Lopez is a 57 y.o. male here today for his post op anal fistulotomy done on 06/23/2017. Patient states he is doing well. No pain. No drainage. Bowels moving well.  HPI  Past Medical History:  Diagnosis Date  . Allergy   . Anxiety   . Arthritis    BACK-LOWER  . Cancer (HCC)    BASAL CELL  . Complication of anesthesia   . GERD (gastroesophageal reflux disease)   . History of kidney stones    h/o  . Low back pain   . PONV (postoperative nausea and vomiting)   . Sleep apnea    USES CPAP  . Vertigo     Past Surgical History:  Procedure Laterality Date  . ANAL FISTULOTOMY N/A 06/23/2017   Procedure: ANAL FISTULOTOMY;  Surgeon: Robert Bellow, MD;  Location: [facility] ORS;  Service: General;  Laterality: N/A;  . APPENDECTOMY    . COLONOSCOPY  2016   Dr [name]  . HERNIA REPAIR Bilateral    10  . SHOULDER ARTHROSCOPY      Family History  Problem Relation Age of Onset  . Alzheimer's disease Father     Social History Social History   Tobacco Use  . Smoking status: Never Smoker  . Smokeless tobacco: Never Used  Substance Use Topics  . Alcohol use: No  . Drug use: No    Allergies  Allergen Reactions  . Promethazine Other (See Comments)    Makes pt VERY JITTERY    Current Outpatient Medications  Medication Sig Dispense Refill  . aspirin 81 MG tablet Take 81 mg by mouth daily.     . citalopram (CELEXA) 20 MG tablet Take 2 tablets (40 mg total) by mouth daily. (Patient taking differently: Take 40 mg by mouth every evening. ) 180 tablet 0  . fexofenadine (ALLEGRA ALLERGY) 180 MG tablet Take 180 mg by mouth at bedtime.     . finasteride (PROSCAR) 5 MG tablet TAKE ONE TABLET BY MOUTH EVERY DAY (Patient taking differently: TAKE ONE TABLET BY MOUTH EVERY DAY-AM) 30 tablet 11  . Multiple Vitamin tablet Take 1 tablet by mouth  daily.     [name] omeprazole (PRILOSEC) 20 MG capsule TAKE 1 CAPSULE BY MOUTH EVERY DAY (Patient taking differently: TAKE 1 CAPSULE BY MOUTH EVERY DAY-PM) 90 capsule 3   No current facility-administered medications for this visit.     Review of Systems Review of Systems  Constitutional: Negative.   Respiratory: Negative.   Cardiovascular: Negative.     Blood pressure 132/76, pulse 74, resp. rate 14, height 6' (1.829 m), weight 236 lb (107 kg).  Physical Exam Physical Exam Clean granulation tissue treated with silver nitrate.     Assessment    Steady progress on resolution of anal fistua.    Plan  Follow up in two weeks.   HPI, Physical Exam, Assessment and Plan have been scribed under the direction and in the presence of Hervey Ard, MD.  Gaspar Cola, CMA  I have completed the exam and reviewed the above documentation for accuracy and completeness.  I agree with the above.  Haematologist has been used and any errors in dictation or transcription are unintentional.  Hervey Ard, M.D., F.A.C.S.  Drew Lopez 07/20/2017, 8:29 AM

## 2017-07-20 NOTE — Patient Instructions (Signed)
Return in two weeks.  

## 2017-08-05 ENCOUNTER — Encounter: Payer: Self-pay | Admitting: General Surgery

## 2017-08-05 ENCOUNTER — Ambulatory Visit (INDEPENDENT_AMBULATORY_CARE_PROVIDER_SITE_OTHER): Payer: 59 | Admitting: General Surgery

## 2017-08-05 VITALS — BP 136/82 | HR 72 | Resp 12 | Ht 72.0 in | Wt 246.0 lb

## 2017-08-05 DIAGNOSIS — K603 Anal fistula: Secondary | ICD-10-CM

## 2017-08-05 NOTE — Progress Notes (Signed)
Patient ID: Drew Lopez, male   DOB: 1960-07-31, 57 y.o.   MRN: [id number]  Chief Complaint  Patient presents with  . Routine Post Op    HPI Drew Lopez is a 57 y.o. male.  Here for postoperative visit, anal fistulotomy 06-23-17. He states he is better occasionally raw feeling but no pain. HPI  Past Medical History:  Diagnosis Date  . Allergy   . Anxiety   . Arthritis    BACK-LOWER  . Cancer (HCC)    BASAL CELL  . Complication of anesthesia   . GERD (gastroesophageal reflux disease)   . History of kidney stones    h/o  . Low back pain   . PONV (postoperative nausea and vomiting)   . Sleep apnea    USES CPAP  . Vertigo     Past Surgical History:  Procedure Laterality Date  . ANAL FISTULOTOMY N/A 06/23/2017   Procedure: ANAL FISTULOTOMY;  Surgeon: Robert Bellow, MD;  Location: [facility] ORS;  Service: General;  Laterality: N/A;  . APPENDECTOMY    . COLONOSCOPY  2016   Dr [name]  . HERNIA REPAIR Bilateral    10  . SHOULDER ARTHROSCOPY      Family History  Problem Relation Age of Onset  . Alzheimer's disease Father     Social History Social History   Tobacco Use  . Smoking status: Never Smoker  . Smokeless tobacco: Never Used  Substance Use Topics  . Alcohol use: No  . Drug use: No    Allergies  Allergen Reactions  . Promethazine Other (See Comments)    Makes pt VERY JITTERY    Current Outpatient Medications  Medication Sig Dispense Refill  . aspirin 81 MG tablet Take 81 mg by mouth daily.     . citalopram (CELEXA) 20 MG tablet Take 2 tablets (40 mg total) by mouth daily. (Patient taking differently: Take 40 mg by mouth every evening. ) 180 tablet 0  . fexofenadine (ALLEGRA ALLERGY) 180 MG tablet Take 180 mg by mouth at bedtime.     . finasteride (PROSCAR) 5 MG tablet TAKE ONE TABLET BY MOUTH EVERY DAY (Patient taking differently: TAKE ONE TABLET BY MOUTH EVERY DAY-AM) 30 tablet 11  . Multiple Vitamin tablet Take 1 tablet by mouth daily.     [name]  omeprazole (PRILOSEC) 20 MG capsule TAKE 1 CAPSULE BY MOUTH EVERY DAY (Patient taking differently: TAKE 1 CAPSULE BY MOUTH EVERY DAY-PM) 90 capsule 3   No current facility-administered medications for this visit.     Review of Systems Review of Systems  Constitutional: Negative.   Respiratory: Negative.   Cardiovascular: Negative.     Blood pressure 136/82, pulse 72, resp. rate 12, height 6' (1.829 m), weight 246 lb (111.6 kg).  Physical Exam Physical Exam  Constitutional: He is oriented to person, place, and time. He appears well-developed and well-nourished.  Genitourinary:     Genitourinary Comments: Small open patch.  Silver nitrate applied   Neurological: He is alert and oriented to person, place, and time.  Skin: Skin is warm and dry.  Psychiatric: He has a normal mood and affect.      Assessment    Doing well post anal fistula treatment.    Plan    I anticipate the small residual area will heal without incident.  The patient was encouraged to call if needed.         [name] Sinaya Minogue 08/05/2017, 1:40 PM

## 2017-08-05 NOTE — Patient Instructions (Signed)
Follow up as needed

## 2017-08-08 ENCOUNTER — Other Ambulatory Visit: Payer: Self-pay | Admitting: Family Medicine

## 2017-08-08 DIAGNOSIS — F4322 Adjustment disorder with anxiety: Secondary | ICD-10-CM

## 2017-09-08 ENCOUNTER — Ambulatory Visit (INDEPENDENT_AMBULATORY_CARE_PROVIDER_SITE_OTHER): Payer: 59 | Admitting: Family Medicine

## 2017-09-08 VITALS — BP 130/70 | HR 77 | Temp 98.3°F | Resp 16 | Ht 72.0 in | Wt 246.0 lb

## 2017-09-08 DIAGNOSIS — Z0000 Encounter for general adult medical examination without abnormal findings: Secondary | ICD-10-CM | POA: Diagnosis not present

## 2017-09-08 DIAGNOSIS — Z125 Encounter for screening for malignant neoplasm of prostate: Secondary | ICD-10-CM

## 2017-09-08 NOTE — Progress Notes (Signed)
Patient: Drew Lopez, Male    DOB: April 23, 1960, 57 y.o.   MRN: [id number] Visit Date: 09/08/2017  Today's Provider: [name], MD   Chief Complaint  Patient presents with  . Annual Exam   Subjective:  Drew Lopez is a 57 y.o. male who presents today for health maintenance and complete physical. He feels fairly well. He reports exercising none. He reports he is sleeping well.   Review of Systems  Constitutional: Negative.   HENT: Positive for congestion, mouth sores and postnasal drip.   Eyes: Negative.   Respiratory: Negative.   Cardiovascular: Negative.   Gastrointestinal: Positive for rectal pain.  Endocrine: Negative.   Genitourinary: Negative.   Musculoskeletal: Positive for arthralgias.  Skin: Negative.   Allergic/Immunologic: Positive for environmental allergies.  Neurological: Negative.   Hematological: Negative.   Psychiatric/Behavioral: Negative.     Social History   Socioeconomic History  . Marital status: Divorced    Spouse name: Not on file  . Number of children: Not on file  . Years of education: Not on file  . Highest education level: Not on file  Occupational History  . Not on file  Social Needs  . Financial resource strain: Not on file  . Food insecurity:    Worry: Not on file    Inability: Not on file  . Transportation needs:    Medical: Not on file    Non-medical: Not on file  Tobacco Use  . Smoking status: Never Smoker  . Smokeless tobacco: Never Used  Substance and Sexual Activity  . Alcohol use: No  . Drug use: No  . Sexual activity: Not on file  Lifestyle  . Physical activity:    Days per week: Not on file    Minutes per session: Not on file  . Stress: Not on file  Relationships  . Social connections:    Talks on phone: Not on file    Gets together: Not on file    Attends religious service: Not on file    Active member of club or organization: Not on file    Attends meetings of clubs or organizations: Not on file     Relationship status: Not on file  . Intimate partner violence:    Fear of current or ex partner: Not on file    Emotionally abused: Not on file    Physically abused: Not on file    Forced sexual activity: Not on file  Other Topics Concern  . Not on file  Social History Narrative  . Not on file    Patient Active Problem List   Diagnosis Date Noted  . Anal fistula 05/26/2017  . AB (asthmatic bronchitis) 11/30/2014  . Clinical depression 11/30/2014  . GERD (gastroesophageal reflux disease) 11/30/2014  . HLD (hyperlipidemia) 11/30/2014  . Blood glucose elevated 11/30/2014  . Cannot sleep 11/30/2014  . Hypotestosteronism 11/30/2014  . Lumbar radiculopathy 11/30/2014  . 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 Obstructive apnea 11/30/2014  . Adjustment disorder with anxiety 05/05/2007  . Allergic rhinitis 11/25/2006    Past Surgical History:  Procedure Laterality Date  . ANAL FISTULOTOMY N/A 06/23/2017   Procedure: ANAL FISTULOTOMY;  Surgeon: Robert Bellow, MD;  Location: [facility] ORS;  Service: General;  Laterality: N/A;  . APPENDECTOMY    . COLONOSCOPY  2016   Dr [name]  . HERNIA REPAIR Bilateral    10  . SHOULDER ARTHROSCOPY      His family history includes Alzheimer's disease in his father.  Outpatient Encounter Medications as of 09/08/2017  Medication Sig  . aspirin 81 MG tablet Take 81 mg by mouth daily.   . citalopram (CELEXA) 20 MG tablet TAKE 2 TABLETS BY MOUTH DAILY  . fexofenadine (ALLEGRA ALLERGY) 180 MG tablet Take 180 mg by mouth at bedtime.   . finasteride (PROSCAR) 5 MG tablet TAKE ONE TABLET BY MOUTH EVERY DAY (Patient taking differently: TAKE ONE TABLET BY MOUTH EVERY DAY-AM)  . Multiple Vitamin tablet Take 1 tablet by mouth daily.   [name] omeprazole (PRILOSEC) 20 MG capsule TAKE 1 CAPSULE BY MOUTH EVERY DAY (Patient taking differently: TAKE 1 CAPSULE BY MOUTH EVERY DAY-PM)   No facility-administered encounter medications on file as of 09/08/2017.     Patient  Care Team: [name]., MD as PCP - General (Family Medicine) [name], [name], MD (General Surgery) [name]., MD (Family Medicine)      Objective:   Vitals:  Vitals:   09/08/17 0924  BP: 130/70  Pulse: 77  Resp: 16  Temp: 98.3 F (36.8 C)  TempSrc: Oral  Weight: 246 lb (111.6 kg)  Height: 6' (1.829 m)    Physical Exam  Constitutional: He is oriented to person, place, and time. He appears well-developed and well-nourished.  HENT:  Head: Normocephalic and atraumatic.  Right Ear: External ear normal.  Left Ear: External ear normal.  Nose: Nose normal.  Mouth/Throat: Oropharynx is clear and moist.  Eyes: Pupils are equal, round, and reactive to light. Conjunctivae and EOM are normal.  Neck: Normal range of motion. Neck supple.  Cardiovascular: Normal rate, regular rhythm, normal heart sounds and intact distal pulses.  Pulmonary/Chest: Effort normal and breath sounds normal.  Abdominal: Soft. Bowel sounds are normal.  Genitourinary: Penis normal.  Musculoskeletal: Normal range of motion.  Neurological: He is alert and oriented to person, place, and time.  Skin: Skin is warm and dry.  Psychiatric: He has a normal mood and affect. His behavior is normal. Judgment and thought content normal.     Depression Screen PHQ 2/9 Scores 09/08/2017 07/16/2015  PHQ - 2 Score 2 2  PHQ- 9 Score 5 7      Assessment & Plan:     Routine Health Maintenance and Physical Exam  Exercise Activities and Dietary recommendations Goals    None      Immunization History  Administered Date(s) Administered  . Influenza,inj,Quad PF,6+ Mos 12/03/2014, 12/20/2015  . Influenza-Unspecified 12/18/2016  . Tdap 11/25/2006    Health Maintenance  Topic Date Due  . HIV Screening  11/17/1975  . TETANUS/TDAP  11/24/2016  . INFLUENZA VACCINE  09/16/2017  . COLONOSCOPY  12/23/2022  . Hepatitis C Screening  Addressed     Discussed health benefits of physical  activity, and encouraged him to engage in regular exercise appropriate for his age and condition.  Anal Fissure Pt has appt with surgery next week. Adjustment Reaction Pt advised to seek counseling. Tinea cruris Resolving.   I have done the exam and reviewed the chart and it is accurate to the best of my knowledge. Development worker, community has been used and  any errors in dictation or transcription are unintentional. Miguel Aschoff M.D. [facility]

## 2017-09-09 ENCOUNTER — Telehealth: Payer: Self-pay

## 2017-09-09 LAB — COMPREHENSIVE METABOLIC PANEL
ALBUMIN: 4.6 g/dL (ref 3.5–5.5)
ALT: 23 IU/L (ref 0–44)
AST: 20 IU/L (ref 0–40)
Albumin/Globulin Ratio: 1.6 (ref 1.2–2.2)
Alkaline Phosphatase: 113 IU/L (ref 39–117)
BILIRUBIN TOTAL: 0.4 mg/dL (ref 0.0–1.2)
BUN / CREAT RATIO: 15 (ref 9–20)
BUN: 16 mg/dL (ref 6–24)
CHLORIDE: 103 mmol/L (ref 96–106)
CO2: 24 mmol/L (ref 20–29)
Calcium: 9.5 mg/dL (ref 8.7–10.2)
Creatinine, Ser: 1.09 mg/dL (ref 0.76–1.27)
GFR calc Af Amer: 87 mL/min/{1.73_m2} (ref >59)
GFR calc non Af Amer: 75 mL/min/{1.73_m2} (ref >59)
Globulin, Total: 2.8 g/dL (ref 1.5–4.5)
Glucose: 103 mg/dL — ABNORMAL HIGH (ref 65–99)
POTASSIUM: 4.4 mmol/L (ref 3.5–5.2)
Sodium: 141 mmol/L (ref 134–144)
TOTAL PROTEIN: 7.4 g/dL (ref 6.0–8.5)

## 2017-09-09 LAB — CBC WITH DIFFERENTIAL/PLATELET
BASOS: 1 %
Basophils Absolute: 0.1 10*3/uL (ref 0.0–0.2)
EOS (ABSOLUTE): 0.3 10*3/uL (ref 0.0–0.4)
Eos: 4 %
Hematocrit: 42.9 % (ref 37.5–51.0)
Hemoglobin: 14.1 g/dL (ref 13.0–17.7)
IMMATURE GRANS (ABS): 0 10*3/uL (ref 0.0–0.1)
Immature Granulocytes: 0 %
LYMPHS ABS: 2.7 10*3/uL (ref 0.7–3.1)
LYMPHS: 36 %
MCH: 28.5 pg (ref 26.6–33.0)
MCHC: 32.9 g/dL (ref 31.5–35.7)
MCV: 87 fL (ref 79–97)
Monocytes Absolute: 0.8 10*3/uL (ref 0.1–0.9)
Monocytes: 11 %
NEUTROS ABS: 3.6 10*3/uL (ref 1.4–7.0)
NEUTROS PCT: 48 %
Platelets: 300 10*3/uL (ref 150–450)
RBC: 4.95 x10E6/uL (ref 4.14–5.80)
RDW: 12.4 % (ref 12.3–15.4)
WBC: 7.5 10*3/uL (ref 3.4–10.8)

## 2017-09-09 LAB — LIPID PANEL WITH LDL/HDL RATIO
Cholesterol, Total: 199 mg/dL (ref 100–199)
HDL: 36 mg/dL — ABNORMAL LOW (ref >39)
LDL Calculated: 125 mg/dL — ABNORMAL HIGH (ref 0–99)
LDl/HDL Ratio: 3.5 ratio (ref 0.0–3.6)
Triglycerides: 189 mg/dL — ABNORMAL HIGH (ref 0–149)
VLDL Cholesterol Cal: 38 mg/dL (ref 5–40)

## 2017-09-09 LAB — TSH: TSH: 2.42 u[IU]/mL (ref 0.450–4.500)

## 2017-09-09 LAB — PSA: PROSTATE SPECIFIC AG, SERUM: 0.6 ng/mL (ref 0.0–4.0)

## 2017-09-09 NOTE — Telephone Encounter (Signed)
[facility]  ED   ----- Message from [name]., MD sent at 09/09/2017  9:53 AM EDT ----- Labs ok--borderline prediabetic. Diet and exercise as discussed.

## 2017-09-09 NOTE — Telephone Encounter (Signed)
Advised patient of results.  

## 2017-09-09 NOTE — Telephone Encounter (Signed)
-----   Message from [name]., MD sent at 09/09/2017  9:53 AM EDT ----- Labs ok--borderline prediabetic. Diet and exercise as discussed.

## 2017-09-14 ENCOUNTER — Encounter: Payer: Self-pay | Admitting: General Surgery

## 2017-09-14 ENCOUNTER — Ambulatory Visit (INDEPENDENT_AMBULATORY_CARE_PROVIDER_SITE_OTHER): Payer: 59 | Admitting: General Surgery

## 2017-09-14 VITALS — BP 124/84 | HR 76 | Resp 12 | Ht 72.0 in | Wt 244.0 lb

## 2017-09-14 DIAGNOSIS — K603 Anal fistula: Secondary | ICD-10-CM

## 2017-09-14 NOTE — Progress Notes (Signed)
Patient ID: Drew Lopez, male   DOB: 08/19/1960, 57 y.o.   MRN: [id number]  Chief Complaint  Patient presents with  . Follow-up    HPI Drew Lopez is a 57 y.o. male.  Here for evaluation of rectal bleeding. He states 10 days ago he had a hard BM with some pain. He then had another Bm with pain and it felt like a tear with bleeding lasting 10 hours. He states it is much better now he just wants the area checked. He is post anal fistulotomy on 06-23-17 and had been doing well.  HPI no evidence of recurrent fistula.  No palpable internal hemorrhoids.  Past Medical History:  Diagnosis Date  . Allergy   . Anxiety   . Arthritis    BACK-LOWER  . Cancer (HCC)    BASAL CELL  . Complication of anesthesia   . GERD (gastroesophageal reflux disease)   . History of kidney stones    h/o  . Low back pain   . PONV (postoperative nausea and vomiting)   . Sleep apnea    USES CPAP  . Vertigo     Past Surgical History:  Procedure Laterality Date  . ANAL FISTULOTOMY N/A 06/23/2017   Procedure: ANAL FISTULOTOMY;  Surgeon: Robert Bellow, MD;  Location: [facility] ORS;  Service: General;  Laterality: N/A;  . APPENDECTOMY    . COLONOSCOPY  2016   Dr [name]  . HERNIA REPAIR Bilateral    10  . SHOULDER ARTHROSCOPY      Family History  Problem Relation Age of Onset  . Alzheimer's disease Father     Social History Social History   Tobacco Use  . Smoking status: Never Smoker  . Smokeless tobacco: Never Used  Substance Use Topics  . Alcohol use: No  . Drug use: No    Allergies  Allergen Reactions  . Promethazine Other (See Comments)    Makes pt VERY JITTERY    Current Outpatient Medications  Medication Sig Dispense Refill  . aspirin 81 MG tablet Take 81 mg by mouth daily.     . citalopram (CELEXA) 20 MG tablet TAKE 2 TABLETS BY MOUTH DAILY 180 tablet 3  . fexofenadine (ALLEGRA ALLERGY) 180 MG tablet Take 180 mg by mouth at bedtime.     . finasteride (PROSCAR) 5 MG tablet TAKE  ONE TABLET BY MOUTH EVERY DAY (Patient taking differently: TAKE ONE TABLET BY MOUTH EVERY DAY-AM) 30 tablet 11  . Multiple Vitamin tablet Take 1 tablet by mouth daily.     [name] omeprazole (PRILOSEC) 20 MG capsule TAKE 1 CAPSULE BY MOUTH EVERY DAY (Patient taking differently: TAKE 1 CAPSULE BY MOUTH EVERY DAY-PM) 90 capsule 3   No current facility-administered medications for this visit.     Review of Systems Review of Systems  Constitutional: Negative.   Respiratory: Negative.   Cardiovascular: Negative.   Gastrointestinal: Positive for anal bleeding.    Blood pressure 124/84, pulse 76, resp. rate 12, height 6' (1.829 m), weight 244 lb (110.7 kg), SpO2 97 %.  Physical Exam Physical Exam  Constitutional: He is oriented to person, place, and time. He appears well-developed and well-nourished.  Genitourinary: Rectum normal.     Genitourinary Comments: area well healed, no bleeding  Neurological: He is alert and oriented to person, place, and time.  Skin: Skin is warm and dry.  Psychiatric: His behavior is normal.       Assessment     No evidence of recurrent fistula.  No evidence of palpable hemorrhoidal disease.      Plan        Recommend adding a daily fiber supplement to help avoid constipation.  The patient is aware to call back for any questions or new concerns.   HPI, Physical Exam, Assessment and Plan have been scribed under the direction and in the presence of Robert Bellow, MD. Karie Fetch, RN  I have completed the exam and reviewed the above documentation for accuracy and completeness.  I agree with the above.  Haematologist has been used and any errors in dictation or transcription are unintentional.  Hervey Ard, M.D., F.A.C.S.  Drew Lopez 09/15/2017, 9:53 PM

## 2017-09-14 NOTE — Patient Instructions (Addendum)
The patient is aware to call back for any questions or concerns.   Recommend adding a daily fiber supplement to help avoid constipation.

## 2017-11-09 ENCOUNTER — Other Ambulatory Visit: Payer: Self-pay | Admitting: Family Medicine

## 2017-11-09 DIAGNOSIS — F4322 Adjustment disorder with anxiety: Secondary | ICD-10-CM

## 2017-11-22 DIAGNOSIS — Z23 Encounter for immunization: Secondary | ICD-10-CM | POA: Diagnosis not present

## 2018-01-03 ENCOUNTER — Ambulatory Visit: Payer: Self-pay | Admitting: Family Medicine

## 2018-01-12 ENCOUNTER — Other Ambulatory Visit: Payer: Self-pay

## 2018-01-12 MED ORDER — PREDNISONE 10 MG PO TABS: 10.0000 mg | ORAL_TABLET | Freq: Every day | ORAL | 0 refills | Status: DC

## 2018-01-17 DIAGNOSIS — M5416 Radiculopathy, lumbar region: Secondary | ICD-10-CM | POA: Diagnosis not present

## 2018-01-20 ENCOUNTER — Other Ambulatory Visit: Payer: Self-pay | Admitting: Neurosurgery

## 2018-01-20 DIAGNOSIS — M542 Cervicalgia: Secondary | ICD-10-CM | POA: Diagnosis not present

## 2018-01-20 DIAGNOSIS — M5412 Radiculopathy, cervical region: Secondary | ICD-10-CM | POA: Diagnosis not present

## 2018-01-20 DIAGNOSIS — M5416 Radiculopathy, lumbar region: Secondary | ICD-10-CM | POA: Diagnosis not present

## 2018-01-27 ENCOUNTER — Encounter (HOSPITAL_COMMUNITY): Payer: Self-pay | Admitting: *Deleted

## 2018-01-27 ENCOUNTER — Other Ambulatory Visit: Payer: Self-pay

## 2018-01-27 NOTE — Progress Notes (Signed)
Pt denies SOB, chest pain, and being under the care of a cardiologist. Pt denies having a stress test, echo and cardiac cath. Pt denies having an EKG within the last year. Pt denies recent labs. Pt stated that a chest x ray was performed at [facility]; records requested. Pt stated that MD made him aware to stop taking  Aspirin,vitamins, fish oil and herbal medications. Do not take any NSAIDs ie: Ibuprofen, Advil, Naproxen (Aleve), Motrin, BC and Goody Powder. Pt verbalized understanding of all pre-op instructions.

## 2018-01-28 ENCOUNTER — Ambulatory Visit (HOSPITAL_COMMUNITY): Payer: 59 | Admitting: Certified Registered Nurse Anesthetist

## 2018-01-28 ENCOUNTER — Ambulatory Visit (HOSPITAL_COMMUNITY)
Admission: RE | Disposition: A | Discharge status: Home / Self Care | Payer: Self-pay | Source: Home / Self Care | Attending: Neurosurgery

## 2018-01-28 ENCOUNTER — Ambulatory Visit (HOSPITAL_COMMUNITY): Payer: 59

## 2018-01-28 ENCOUNTER — Ambulatory Visit (HOSPITAL_COMMUNITY)
Admission: RE | Admit: 2018-01-28 | Discharge: 2018-01-28 | Disposition: A | Discharge status: Home / Self Care | Payer: 59 | Attending: Neurosurgery | Admitting: Neurosurgery

## 2018-01-28 ENCOUNTER — Encounter (HOSPITAL_COMMUNITY): Payer: Self-pay | Admitting: Certified Registered Nurse Anesthetist

## 2018-01-28 DIAGNOSIS — K219 Gastro-esophageal reflux disease without esophagitis: Secondary | ICD-10-CM | POA: Diagnosis not present

## 2018-01-28 DIAGNOSIS — Z981 Arthrodesis status: Secondary | ICD-10-CM | POA: Diagnosis not present

## 2018-01-28 DIAGNOSIS — Z87442 Personal history of urinary calculi: Secondary | ICD-10-CM | POA: Insufficient documentation

## 2018-01-28 DIAGNOSIS — M4722 Other spondylosis with radiculopathy, cervical region: Secondary | ICD-10-CM | POA: Diagnosis not present

## 2018-01-28 DIAGNOSIS — G4730 Sleep apnea, unspecified: Secondary | ICD-10-CM | POA: Diagnosis not present

## 2018-01-28 DIAGNOSIS — Z7982 Long term (current) use of aspirin: Secondary | ICD-10-CM | POA: Diagnosis not present

## 2018-01-28 DIAGNOSIS — F419 Anxiety disorder, unspecified: Secondary | ICD-10-CM | POA: Insufficient documentation

## 2018-01-28 DIAGNOSIS — M5020 Other cervical disc displacement, unspecified cervical region: Secondary | ICD-10-CM | POA: Diagnosis present

## 2018-01-28 DIAGNOSIS — Z419 Encounter for procedure for purposes other than remedying health state, unspecified: Secondary | ICD-10-CM

## 2018-01-28 DIAGNOSIS — Z79899 Other long term (current) drug therapy: Secondary | ICD-10-CM | POA: Diagnosis not present

## 2018-01-28 DIAGNOSIS — M5013 Cervical disc disorder with radiculopathy, cervicothoracic region: Secondary | ICD-10-CM | POA: Insufficient documentation

## 2018-01-28 HISTORY — PX: POSTERIOR CERVICAL LAMINECTOMY: SHX2248

## 2018-01-28 LAB — CBC
HCT: 44.7 % (ref 39.0–52.0)
Hemoglobin: 13.9 g/dL (ref 13.0–17.0)
MCH: 27.6 pg (ref 26.0–34.0)
MCHC: 31.1 g/dL (ref 30.0–36.0)
MCV: 88.7 fL (ref 80.0–100.0)
PLATELETS: 245 10*3/uL (ref 150–400)
RBC: 5.04 MIL/uL (ref 4.22–5.81)
RDW: 12.6 % (ref 11.5–15.5)
WBC: 7.3 10*3/uL (ref 4.0–10.5)
nRBC: 0 % (ref 0.0–0.2)

## 2018-01-28 LAB — TYPE AND SCREEN
ABO/RH(D): O POS
Antibody Screen: NEGATIVE

## 2018-01-28 LAB — ABO/RH: ABO/RH(D): O POS

## 2018-01-28 SURGERY — POSTERIOR CERVICAL LAMINECTOMY
Anesthesia: General

## 2018-01-28 MED ORDER — BACITRACIN ZINC 500 UNIT/GM EX OINT
TOPICAL_OINTMENT | CUTANEOUS | Status: DC | PRN
  Administered 2018-01-28: 1 via TOPICAL

## 2018-01-28 MED ORDER — BUPIVACAINE HCL (PF) 0.25 % IJ SOLN
INTRAMUSCULAR | Status: DC | PRN
  Administered 2018-01-28: 4 mL

## 2018-01-28 MED ORDER — THROMBIN 5000 UNITS EX SOLR
CUTANEOUS | Status: AC
  Filled 2018-01-28: qty 5000

## 2018-01-28 MED ORDER — SODIUM CHLORIDE 0.9% FLUSH
3.0000 mL | Freq: Two times a day (BID) | INTRAVENOUS | Status: DC
  Administered 2018-01-28: 3 mL via INTRAVENOUS

## 2018-01-28 MED ORDER — PROPOFOL 10 MG/ML IV BOLUS
INTRAVENOUS | Status: DC | PRN
  Administered 2018-01-28: 200 mg via INTRAVENOUS
  Administered 2018-01-28: 50 mg via INTRAVENOUS

## 2018-01-28 MED ORDER — OXYCODONE HCL 5 MG PO TABS: 5.0000 mg | ORAL_TABLET | Freq: Once | ORAL | Status: DC | PRN

## 2018-01-28 MED ORDER — FINASTERIDE 5 MG PO TABS: 5.0000 mg | ORAL_TABLET | Freq: Every day | ORAL | Status: DC

## 2018-01-28 MED ORDER — ONDANSETRON HCL 4 MG/2ML IJ SOLN
INTRAMUSCULAR | Status: DC | PRN
  Administered 2018-01-28: 4 mg via INTRAVENOUS

## 2018-01-28 MED ORDER — BACITRACIN ZINC 500 UNIT/GM EX OINT
TOPICAL_OINTMENT | CUTANEOUS | Status: AC
  Filled 2018-01-28: qty 28.35

## 2018-01-28 MED ORDER — BUPIVACAINE HCL (PF) 0.25 % IJ SOLN
INTRAMUSCULAR | Status: AC
  Filled 2018-01-28: qty 30

## 2018-01-28 MED ORDER — SUGAMMADEX SODIUM 500 MG/5ML IV SOLN
INTRAVENOUS | Status: DC | PRN
  Administered 2018-01-28: 250 mg via INTRAVENOUS

## 2018-01-28 MED ORDER — FENTANYL CITRATE (PF) 100 MCG/2ML IJ SOLN
INTRAMUSCULAR | Status: DC | PRN
  Administered 2018-01-28 (×3): 50 ug via INTRAVENOUS

## 2018-01-28 MED ORDER — LIDOCAINE-EPINEPHRINE 1 %-1:100000 IJ SOLN
INTRAMUSCULAR | Status: DC | PRN
  Administered 2018-01-28: 4 mL

## 2018-01-28 MED ORDER — ACETAMINOPHEN 10 MG/ML IV SOLN
INTRAVENOUS | Status: DC | PRN
  Administered 2018-01-28: 1000 mg via INTRAVENOUS

## 2018-01-28 MED ORDER — ROCURONIUM BROMIDE 50 MG/5ML IV SOSY
PREFILLED_SYRINGE | INTRAVENOUS | Status: AC
  Filled 2018-01-28: qty 10

## 2018-01-28 MED ORDER — ONDANSETRON HCL 4 MG/2ML IJ SOLN: 4.0000 mg | Freq: Once | INTRAMUSCULAR | Status: DC | PRN

## 2018-01-28 MED ORDER — HYDROMORPHONE HCL 1 MG/ML IJ SOLN
0.2500 mg | INTRAMUSCULAR | Status: DC | PRN
  Administered 2018-01-28 (×2): 0.5 mg via INTRAVENOUS

## 2018-01-28 MED ORDER — LORATADINE 10 MG PO TABS: 10.0000 mg | ORAL_TABLET | Freq: Every day | ORAL | Status: DC

## 2018-01-28 MED ORDER — SODIUM CHLORIDE 0.9% FLUSH: 3.0000 mL | INTRAVENOUS | Status: DC | PRN

## 2018-01-28 MED ORDER — ADULT MULTIVITAMIN W/MINERALS CH: 1.0000 | ORAL_TABLET | Freq: Every day | ORAL | Status: DC

## 2018-01-28 MED ORDER — ALUM & MAG HYDROXIDE-SIMETH 200-200-20 MG/5ML PO SUSP: 30.0000 mL | Freq: Four times a day (QID) | ORAL | Status: DC | PRN

## 2018-01-28 MED ORDER — CEFAZOLIN SODIUM-DEXTROSE 2-4 GM/100ML-% IV SOLN
2.0000 g | Freq: Once | INTRAVENOUS | Status: AC
  Administered 2018-01-28: 2 g via INTRAVENOUS
  Filled 2018-01-28: qty 100

## 2018-01-28 MED ORDER — LIDOCAINE 2% (20 MG/ML) 5 ML SYRINGE
INTRAMUSCULAR | Status: DC | PRN
  Administered 2018-01-28: 100 mg via INTRAVENOUS

## 2018-01-28 MED ORDER — OXYCODONE HCL 5 MG PO TABS: 5.0000 mg | ORAL_TABLET | Freq: Four times a day (QID) | ORAL | 0 refills | Status: AC | PRN

## 2018-01-28 MED ORDER — THROMBIN 5000 UNITS EX SOLR
CUTANEOUS | Status: AC
  Filled 2018-01-28: qty 10000

## 2018-01-28 MED ORDER — CITALOPRAM HYDROBROMIDE 20 MG PO TABS: 40.0000 mg | ORAL_TABLET | Freq: Every day | ORAL | Status: DC

## 2018-01-28 MED ORDER — PANTOPRAZOLE SODIUM 40 MG IV SOLR: 40.0000 mg | Freq: Every day | INTRAVENOUS | Status: DC

## 2018-01-28 MED ORDER — CEFAZOLIN SODIUM-DEXTROSE 2-4 GM/100ML-% IV SOLN
2.0000 g | Freq: Three times a day (TID) | INTRAVENOUS | Status: DC
  Administered 2018-01-28: 2 g via INTRAVENOUS
  Filled 2018-01-28: qty 100

## 2018-01-28 MED ORDER — ROCURONIUM BROMIDE 50 MG/5ML IV SOSY
PREFILLED_SYRINGE | INTRAVENOUS | Status: DC | PRN
  Administered 2018-01-28: 10 mg via INTRAVENOUS
  Administered 2018-01-28: 50 mg via INTRAVENOUS
  Administered 2018-01-28 (×2): 20 mg via INTRAVENOUS

## 2018-01-28 MED ORDER — KETOROLAC TROMETHAMINE 30 MG/ML IJ SOLN
INTRAMUSCULAR | Status: DC | PRN
  Administered 2018-01-28: 30 mg via INTRAVENOUS

## 2018-01-28 MED ORDER — MIDAZOLAM HCL 5 MG/5ML IJ SOLN
INTRAMUSCULAR | Status: DC | PRN
  Administered 2018-01-28: 2 mg via INTRAVENOUS

## 2018-01-28 MED ORDER — PANTOPRAZOLE SODIUM 40 MG PO TBEC: 40.0000 mg | DELAYED_RELEASE_TABLET | Freq: Every day | ORAL | Status: DC

## 2018-01-28 MED ORDER — SUGAMMADEX SODIUM 500 MG/5ML IV SOLN
INTRAVENOUS | Status: AC
  Filled 2018-01-28: qty 5

## 2018-01-28 MED ORDER — SODIUM CHLORIDE 0.9 % IV SOLN: 250.0000 mL | INTRAVENOUS | Status: DC

## 2018-01-28 MED ORDER — ACETAMINOPHEN 10 MG/ML IV SOLN
INTRAVENOUS | Status: AC
  Filled 2018-01-28: qty 100

## 2018-01-28 MED ORDER — ACETAMINOPHEN 325 MG PO TABS: 650.0000 mg | ORAL_TABLET | ORAL | Status: DC | PRN

## 2018-01-28 MED ORDER — PHENOL 1.4 % MT LIQD: 1.0000 | OROMUCOSAL | Status: DC | PRN

## 2018-01-28 MED ORDER — CYCLOBENZAPRINE HCL 10 MG PO TABS: 10.0000 mg | ORAL_TABLET | Freq: Three times a day (TID) | ORAL | Status: DC | PRN

## 2018-01-28 MED ORDER — OXYCODONE HCL 5 MG/5ML PO SOLN: 5.0000 mg | Freq: Once | ORAL | Status: DC | PRN

## 2018-01-28 MED ORDER — HYDROMORPHONE HCL 1 MG/ML IJ SOLN
INTRAMUSCULAR | Status: AC
  Filled 2018-01-28: qty 1

## 2018-01-28 MED ORDER — THROMBIN 5000 UNITS EX SOLR
CUTANEOUS | Status: DC | PRN
  Administered 2018-01-28 (×2): 5000 [IU] via TOPICAL

## 2018-01-28 MED ORDER — SODIUM CHLORIDE 0.9 % IV SOLN
INTRAVENOUS | Status: DC | PRN
  Administered 2018-01-28: 25 ug/min via INTRAVENOUS

## 2018-01-28 MED ORDER — PROPOFOL 10 MG/ML IV BOLUS
INTRAVENOUS | Status: AC
  Filled 2018-01-28: qty 20

## 2018-01-28 MED ORDER — SODIUM CHLORIDE 0.9 % IV SOLN
INTRAVENOUS | Status: DC | PRN
  Administered 2018-01-28: 11:00:00

## 2018-01-28 MED ORDER — HYDROMORPHONE HCL 1 MG/ML IJ SOLN: 0.5000 mg | INTRAMUSCULAR | Status: DC | PRN

## 2018-01-28 MED ORDER — LACTATED RINGERS IV SOLN
INTRAVENOUS | Status: DC
  Administered 2018-01-28 (×2): via INTRAVENOUS

## 2018-01-28 MED ORDER — METHOCARBAMOL 500 MG PO TABS: 500.0000 mg | ORAL_TABLET | Freq: Four times a day (QID) | ORAL | 0 refills | Status: DC

## 2018-01-28 MED ORDER — ASPIRIN 81 MG PO CHEW: 81.0000 mg | CHEWABLE_TABLET | Freq: Every day | ORAL | Status: DC

## 2018-01-28 MED ORDER — LIDOCAINE-EPINEPHRINE 1 %-1:100000 IJ SOLN
INTRAMUSCULAR | Status: AC
  Filled 2018-01-28: qty 1

## 2018-01-28 MED ORDER — TRAMADOL HCL 50 MG PO TABS: 50.0000 mg | ORAL_TABLET | Freq: Four times a day (QID) | ORAL | Status: DC | PRN

## 2018-01-28 MED ORDER — FENTANYL CITRATE (PF) 250 MCG/5ML IJ SOLN
INTRAMUSCULAR | Status: AC
  Filled 2018-01-28: qty 5

## 2018-01-28 MED ORDER — DEXAMETHASONE SODIUM PHOSPHATE 10 MG/ML IJ SOLN
INTRAMUSCULAR | Status: DC | PRN
  Administered 2018-01-28: 10 mg via INTRAVENOUS

## 2018-01-28 MED ORDER — MIDAZOLAM HCL 2 MG/2ML IJ SOLN
INTRAMUSCULAR | Status: AC
  Filled 2018-01-28: qty 2

## 2018-01-28 MED ORDER — ACETAMINOPHEN 650 MG RE SUPP: 650.0000 mg | RECTAL | Status: DC | PRN

## 2018-01-28 MED ORDER — MENTHOL 3 MG MT LOZG: 1.0000 | LOZENGE | OROMUCOSAL | Status: DC | PRN

## 2018-01-28 MED ORDER — ONDANSETRON HCL 4 MG/2ML IJ SOLN: 4.0000 mg | Freq: Four times a day (QID) | INTRAMUSCULAR | Status: DC | PRN

## 2018-01-28 MED ORDER — ONDANSETRON HCL 4 MG PO TABS: 4.0000 mg | ORAL_TABLET | Freq: Four times a day (QID) | ORAL | Status: DC | PRN

## 2018-01-28 MED ORDER — HEMOSTATIC AGENTS (NO CHARGE) OPTIME
TOPICAL | Status: DC | PRN
  Administered 2018-01-28: 1 via TOPICAL

## 2018-01-28 MED ORDER — DEXAMETHASONE SODIUM PHOSPHATE 10 MG/ML IJ SOLN
INTRAMUSCULAR | Status: AC
  Filled 2018-01-28: qty 1

## 2018-01-28 MED ORDER — OXYCODONE HCL 5 MG PO TABS: 10.0000 mg | ORAL_TABLET | ORAL | Status: DC | PRN

## 2018-01-28 MED ORDER — ONDANSETRON HCL 4 MG/2ML IJ SOLN
INTRAMUSCULAR | Status: AC
  Filled 2018-01-28: qty 2

## 2018-01-28 SURGICAL SUPPLY — 63 items
BAG DECANTER FOR FLEXI CONT (MISCELLANEOUS) ×2 IMPLANT
BASKET BONE COLLECTION (BASKET) ×2 IMPLANT
BENZOIN TINCTURE PRP APPL 2/3 (GAUZE/BANDAGES/DRESSINGS) ×2 IMPLANT
BLADE CLIPPER SURG (BLADE) ×2 IMPLANT
BLADE SURG 11 STRL SS (BLADE) ×2 IMPLANT
BUR MATCHSTICK NEURO 3.0 LAGG (BURR) ×2 IMPLANT
CANISTER SUCT 3000ML PPV (MISCELLANEOUS) ×2 IMPLANT
CARTRIDGE OIL MAESTRO DRILL (MISCELLANEOUS) ×1 IMPLANT
COVER WAND RF STERILE (DRAPES) ×2 IMPLANT
DECANTER SPIKE VIAL GLASS SM (MISCELLANEOUS) ×2 IMPLANT
DERMABOND ADVANCED (GAUZE/BANDAGES/DRESSINGS) ×1
DERMABOND ADVANCED .7 DNX12 (GAUZE/BANDAGES/DRESSINGS) ×1 IMPLANT
DIFFUSER DRILL AIR PNEUMATIC (MISCELLANEOUS) ×2 IMPLANT
DRAPE C-ARM 42X72 X-RAY (DRAPES) ×4 IMPLANT
DRAPE LAPAROTOMY 100X72 PEDS (DRAPES) ×2 IMPLANT
DRAPE MICROSCOPE LEICA (MISCELLANEOUS) IMPLANT
DRAPE SURG 17X23 STRL (DRAPES) ×2 IMPLANT
DRSG OPSITE POSTOP 4X6 (GAUZE/BANDAGES/DRESSINGS) ×2 IMPLANT
DURAPREP 26ML APPLICATOR (WOUND CARE) ×2 IMPLANT
ELECT BLADE 4.0 EZ CLEAN MEGAD (MISCELLANEOUS) ×2
ELECT REM PT RETURN 9FT ADLT (ELECTROSURGICAL) ×2
ELECTRODE BLDE 4.0 EZ CLN MEGD (MISCELLANEOUS) ×1 IMPLANT
ELECTRODE REM PT RTRN 9FT ADLT (ELECTROSURGICAL) ×1 IMPLANT
GAUZE 4X4 16PLY RFD (DISPOSABLE) IMPLANT
GAUZE SPONGE 4X4 12PLY STRL (GAUZE/BANDAGES/DRESSINGS) ×2 IMPLANT
GAUZE SPONGE 4X4 16PLY XRAY LF (GAUZE/BANDAGES/DRESSINGS) ×2 IMPLANT
GLOVE BIO SURGEON STRL SZ7 (GLOVE) IMPLANT
GLOVE BIO SURGEON STRL SZ8 (GLOVE) ×2 IMPLANT
GLOVE BIOGEL PI IND STRL 7.0 (GLOVE) ×1 IMPLANT
GLOVE BIOGEL PI IND STRL 7.5 (GLOVE) ×1 IMPLANT
GLOVE BIOGEL PI INDICATOR 7.0 (GLOVE) ×1
GLOVE BIOGEL PI INDICATOR 7.5 (GLOVE) ×1
GLOVE EXAM NITRILE XL STR (GLOVE) IMPLANT
GLOVE INDICATOR 8.5 STRL (GLOVE) ×2 IMPLANT
GLOVE SS N UNI LF 6.5 STRL (GLOVE) ×6 IMPLANT
GOWN STRL REUS W/ TWL LRG LVL3 (GOWN DISPOSABLE) IMPLANT
GOWN STRL REUS W/ TWL XL LVL3 (GOWN DISPOSABLE) ×1 IMPLANT
GOWN STRL REUS W/TWL 2XL LVL3 (GOWN DISPOSABLE) IMPLANT
GOWN STRL REUS W/TWL LRG LVL3 (GOWN DISPOSABLE)
GOWN STRL REUS W/TWL XL LVL3 (GOWN DISPOSABLE) ×1
KIT BASIN OR (CUSTOM PROCEDURE TRAY) ×2 IMPLANT
KIT TURNOVER KIT B (KITS) ×2 IMPLANT
MARKER SKIN DUAL TIP RULER LAB (MISCELLANEOUS) ×2 IMPLANT
NEEDLE HYPO 25X1 1.5 SAFETY (NEEDLE) ×2 IMPLANT
NEEDLE SPNL 20GX3.5 QUINCKE YW (NEEDLE) ×2 IMPLANT
NS IRRIG 1000ML POUR BTL (IV SOLUTION) ×2 IMPLANT
OIL CARTRIDGE MAESTRO DRILL (MISCELLANEOUS) ×2
PACK LAMINECTOMY NEURO (CUSTOM PROCEDURE TRAY) ×2 IMPLANT
PAD ARMBOARD 7.5X6 YLW CONV (MISCELLANEOUS) ×6 IMPLANT
PIN MAYFIELD SKULL DISP (PIN) ×2 IMPLANT
RUBBERBAND STERILE (MISCELLANEOUS) IMPLANT
SPONGE LAP 4X18 RFD (DISPOSABLE) IMPLANT
SPONGE SURGIFOAM ABS GEL SZ50 (HEMOSTASIS) ×2 IMPLANT
STRIP CLOSURE SKIN 1/2X4 (GAUZE/BANDAGES/DRESSINGS) ×2 IMPLANT
SUT ETHILON 4 0 PS 2 18 (SUTURE) IMPLANT
SUT VIC AB 0 CT1 18XCR BRD8 (SUTURE) ×1 IMPLANT
SUT VIC AB 0 CT1 8-18 (SUTURE) ×1
SUT VIC AB 2-0 CT1 18 (SUTURE) ×2 IMPLANT
SUT VICRYL 4-0 PS2 18IN ABS (SUTURE) ×2 IMPLANT
TOWEL GREEN STERILE (TOWEL DISPOSABLE) ×2 IMPLANT
TOWEL GREEN STERILE FF (TOWEL DISPOSABLE) ×2 IMPLANT
TRAY FOLEY MTR SLVR 16FR STAT (SET/KITS/TRAYS/PACK) IMPLANT
WATER STERILE IRR 1000ML POUR (IV SOLUTION) ×2 IMPLANT

## 2018-01-28 NOTE — Op Note (Signed)
Preoperative diagnosis: Left C8 radiculopathy from cervical spondylosis and herniated nucleus pulposus C7-T1  Postoperative diagnosis: Same  Procedure: Cervical laminectomy laminotomy foraminotomy and microdiscectomy C7-T1 with microscopic dissection of the C8 nerve roots and microscopic discectomy  Surgeon: Dominica Severin Harlis Champoux  Anesthesia: Gen.  EBL: Minimal  History of present illness: 57 year old gentleman with neck pain left shoulder and arm pain weakness in his hands workup revealed herniated disc versus spondylosis compressing the left C8 nerve root. And due to his weakness in his left arm progressive plical syndrome imaging findings and recommended laminectomy microdiscectomy at C7-T1 left I extensively gone over the risks and benefits of the operation with him as well as perioperative course expectations of outcome and alternatives of surgery and he understood and agreed to proceed forward.   Operative procedure: Patient brought into the or was induced on general anesthesia positioned prone in pins back side of his neck was prepped and draped in routine sterile fashion after infiltration of 10 mL lidocaine with epi a midline incision was made and Bovie light cautery was used to calcification tissues and subperiosteal dissections care lamina of C7 and T1 the left. Interoperative x-ray confirmed identification of the appropriate level being C5-6 and 2 disc spaces below this. So to this is below where the x-ray was taken I drilled him for aspect to C7 super aspect T1 and down the medial facet complex. Began laminotomy with a 1 and 2 mm Kerrison punch to divide the thecal sac and identified the T1 pedicle and marched out the foramen above this along the C8 nerve root. Unroofed the C8 neural foramen decompressing the dorsal aspect of the C8 nerve root identified a very large disc herniation just inferior coming out of the C7-T1 disc space. Incised the disc space and teased out several fragments of disc with  accommodation of a black nerve hook and a titanium micro-nerve hook. Atlantis Kitners no further stenosis of the C8 nerve root was widely decompressed the wounds and copious irrigated meticulous hemostasis was maintained Gelfoam was laid top of the dura the muscle fascia approximately layers with interrupted Vicryl skin was closed running 4 subcuticular Dermabond benzo and Steri-Strips and a sterile dressing was applied patient recovered in stable condition. At the end the case all needle counts sponge counts were correct.

## 2018-01-28 NOTE — Anesthesia Preprocedure Evaluation (Addendum)
Anesthesia Evaluation  Patient identified by MRN, date of birth, ID band Patient awake    Reviewed: Allergy & Precautions, NPO status , Patient's Chart, lab work & pertinent test results  History of Anesthesia Complications (+) PONVNegative for: history of anesthetic complications  Airway Mallampati: II  TM Distance: >3 FB Neck ROM: Full    Dental no notable dental hx. (+) Teeth Intact, Dental Advisory Given   Pulmonary asthma , sleep apnea and Continuous Positive Airway Pressure Ventilation ,    Pulmonary exam normal        Cardiovascular negative cardio ROS Normal cardiovascular exam     Neuro/Psych PSYCHIATRIC DISORDERS Anxiety Depression negative neurological ROS     GI/Hepatic Neg liver ROS, GERD  ,  Endo/Other  negative endocrine ROS  Renal/GU negative Renal ROS  negative genitourinary   Musculoskeletal  (+) Arthritis ,   Abdominal   Peds  Hematology negative hematology ROS (+)   Anesthesia Other Findings 57 yo M for C7-T1 laminectomy for cervical stenosis - PONV, anx/dep, GERD, OSA on CPAP, BMI 33  Reproductive/Obstetrics                            Anesthesia Physical Anesthesia Plan  ASA: III  Anesthesia Plan: General   Post-op Pain Management:    Induction: Intravenous  PONV Risk Score and Plan: 3 and Ondansetron, Dexamethasone, Midazolam and Treatment may vary due to age or medical condition  Airway Management Planned: Oral ETT  Additional Equipment: None  Intra-op Plan:   Post-operative Plan: Extubation in OR  Informed Consent: I have reviewed the patients History and Physical, chart, labs and discussed the procedure including the risks, benefits and alternatives for the proposed anesthesia with the patient or authorized representative who has indicated his/her understanding and acceptance.   Dental advisory given  Plan Discussed with:   Anesthesia Plan  Comments:        Anesthesia Quick Evaluation

## 2018-01-28 NOTE — Plan of Care (Signed)
  Problem: Education: Goal: Ability to verbalize activity precautions or restrictions will improve Outcome: Completed/Met Goal: Knowledge of the prescribed therapeutic regimen will improve Outcome: Completed/Met Goal: Understanding of discharge needs will improve Outcome: Completed/Met   Problem: Activity: Goal: Ability to avoid complications of mobility impairment will improve Outcome: Completed/Met Goal: Ability to tolerate increased activity will improve Outcome: Completed/Met Goal: Will remain free from falls Outcome: Completed/Met   Problem: Bowel/Gastric: Goal: Gastrointestinal status for postoperative course will improve Outcome: Completed/Met   Problem: Clinical Measurements: Goal: Ability to maintain clinical measurements within normal limits will improve Outcome: Completed/Met Goal: Postoperative complications will be avoided or minimized Outcome: Completed/Met Goal: Diagnostic test results will improve Outcome: Completed/Met   Problem: Pain Management: Goal: Pain level will decrease Outcome: Completed/Met   Problem: Skin Integrity: Goal: Will show signs of wound healing Outcome: Completed/Met   Problem: Health Behavior/Discharge Planning: Goal: Identification of resources available to assist in meeting health care needs will improve Outcome: Completed/Met   Problem: Bladder/Genitourinary: Goal: Urinary functional status for postoperative course will improve Outcome: Completed/Met   Problem: Education: Goal: Knowledge of General Education information will improve Description Including pain rating scale, medication(s)/side effects and non-pharmacologic comfort measures Outcome: Completed/Met   Problem: Health Behavior/Discharge Planning: Goal: Ability to manage health-related needs will improve Outcome: Completed/Met   Problem: Clinical Measurements: Goal: Ability to maintain clinical measurements within normal limits will improve Outcome:  Completed/Met Goal: Will remain free from infection Outcome: Completed/Met Goal: Diagnostic test results will improve Outcome: Completed/Met Goal: Respiratory complications will improve Outcome: Completed/Met Goal: Cardiovascular complication will be avoided Outcome: Completed/Met   Problem: Activity: Goal: Risk for activity intolerance will decrease Outcome: Completed/Met   Problem: Nutrition: Goal: Adequate nutrition will be maintained Outcome: Completed/Met   Problem: Coping: Goal: Level of anxiety will decrease Outcome: Completed/Met   Problem: Elimination: Goal: Will not experience complications related to bowel motility Outcome: Completed/Met Goal: Will not experience complications related to urinary retention Outcome: Completed/Met   Problem: Pain Managment: Goal: General experience of comfort will improve Outcome: Completed/Met   Problem: Safety: Goal: Ability to remain free from injury will improve Outcome: Completed/Met   Problem: Skin Integrity: Goal: Risk for impaired skin integrity will decrease Outcome: Completed/Met

## 2018-01-28 NOTE — H&P (Signed)
Drew Lopez is an 57 y.o. male.   Chief Complaint: neck pain left arm pain HPI: 57 year old gentleman with a progress worsening neck left shoulder pain rating down to the outside of his left hand with numbness tingling and last couple fingers of his left hand weakness in his hand intrinsics. The vast majority the pain discomfort is inferior shoulder blade on the left. Workup has revealed cervical spondylosis over multiple levels but is severe foraminal stenosis and compression of the left C8 nerve root. This correlate with clinical syndrome and*recommended posterior cervical laminectomy foraminotomy and possible discectomy on the left at C7-T1. I've extensively gone over the risks and benefits of the operation with him as well as perioperative course expectations of outcome and alternatives of surgery and he understood and agreed to proceed 4.  Past Medical History:  Diagnosis Date  . Allergy   . Anxiety   . Arthritis    BACK-LOWER  . Cancer (HCC)    BASAL CELL  . Complication of anesthesia   . GERD (gastroesophageal reflux disease)   . History of kidney stones    h/o  . Low back pain   . PONV (postoperative nausea and vomiting)   . Sleep apnea    USES CPAP  . Vertigo     Past Surgical History:  Procedure Laterality Date  . ANAL FISTULOTOMY N/A 06/23/2017   Procedure: ANAL FISTULOTOMY;  Surgeon: Drew Bellow, MD;  Location: [facility] ORS;  Service: General;  Laterality: N/A;  . APPENDECTOMY    . COLONOSCOPY  2016   Dr Drew Lopez  . HERNIA REPAIR Bilateral    10  . SHOULDER ARTHROSCOPY    . TONSILLECTOMY      Family History  Problem Relation Age of Onset  . Alzheimer's disease Father    Social History:  reports that he has never smoked. He has never used smokeless tobacco. He reports that he does not drink alcohol or use drugs.  Allergies:  Allergies  Allergen Reactions  . Promethazine Other (See Comments)    Makes pt VERY JITTERY    Medications Prior to Admission   Medication Sig Dispense Refill  . citalopram (CELEXA) 20 MG tablet Take 2 tablets (40 mg total) by mouth every evening. (Patient taking differently: Take 40 mg by mouth daily with lunch. ) 180 tablet 3  . fexofenadine (ALLEGRA ALLERGY) 180 MG tablet Take 180 mg by mouth at bedtime.     . finasteride (PROSCAR) 5 MG tablet TAKE ONE TABLET BY MOUTH EVERY DAY (Patient taking differently: Take 5 mg by mouth daily. ) 90 tablet 3  . Multiple Vitamin tablet Take 1 tablet by mouth daily.     [name] omeprazole (PRILOSEC) 20 MG capsule TAKE 1 CAPSULE BY MOUTH EVERY DAY (Patient taking differently: Take 20 mg by mouth daily. ) 90 capsule 3  . traMADol (ULTRAM) 50 MG tablet Take 50 mg by mouth every 6 (six) hours as needed for moderate pain.  0  . aspirin 81 MG tablet Take 81 mg by mouth daily.       Results for orders placed or performed during the hospital encounter of 01/28/18 (from the past 48 hour(s))  Type and screen Drew Lopez     Status: None (Preliminary result)   Collection Time: 01/28/18  9:10 AM  Result Value Ref Range   ABO/RH(D) PENDING    Antibody Screen PENDING    Sample Expiration      01/31/2018 Performed at [facility],  [address], [place], [place] [address]    No results found.  Review of Systems  Musculoskeletal: Positive for neck pain.  Neurological: Positive for sensory change and focal weakness.    Blood pressure (!) 157/100, pulse 90, temperature 98.2 F (36.8 C), resp. rate 20, height 6' (1.829 m), weight 111.1 kg, SpO2 98 %. Physical Exam  Constitutional: He is oriented to person, place, and time. He appears well-developed and well-nourished.  HENT:  Head: Normocephalic.  Eyes: Pupils are equal, round, and reactive to light.  Respiratory: Effort normal.  GI: Soft. Bowel sounds are normal.  Neurological: He is alert and oriented to person, place, and time. He has normal strength. GCS eye subscore is 4. GCS verbal subscore is 5. GCS motor  subscore is 6.  Right upper extremity strength is 5 out of 5 deltoid, bicep, tricep, wrist flexion, wrist extension, and intrinsics. Left upper extremities place weakness in the intrinsics and a little weakness in his tricep. Rated about 4+ out of 5     Assessment/Plan 57 year old gentleman presents for a left C7-T1 laminotomy foraminotomy and possible discectomy.  Drew Lopez P, MD 01/28/2018, 9:59 AM

## 2018-01-28 NOTE — Discharge Instructions (Signed)

## 2018-01-28 NOTE — Anesthesia Procedure Notes (Signed)
Procedure Name: Intubation Date/Time: 01/28/2018 10:47 AM Performed by: Candis Shine, CRNA Pre-anesthesia Checklist: Patient identified, Emergency Drugs available, Suction available and Patient being monitored Patient Re-evaluated:Patient Re-evaluated prior to induction Oxygen Delivery Method: Circle System Utilized Preoxygenation: Pre-oxygenation with 100% oxygen Induction Type: IV induction Ventilation: Two handed mask ventilation required and Oral airway inserted - appropriate to patient size Laryngoscope Size: Glidescope and 4 Grade View: Grade I Tube type: Oral Tube size: 7.5 mm Number of attempts: 1 Airway Equipment and Method: Oral airway,  Video-laryngoscopy and Rigid stylet Placement Confirmation: ETT inserted through vocal cords under direct vision,  positive ETCO2 and breath sounds checked- equal and bilateral Secured at: 25 cm Tube secured with: Tape Dental Injury: Teeth and Oropharynx as per pre-operative assessment  Difficulty Due To: Difficult Airway- due to reduced neck mobility

## 2018-01-28 NOTE — Progress Notes (Signed)
Orthopedic Tech Progress Note Patient Details:  Drew Lopez 1960/10/30 [id number]  Ortho Devices Type of Ortho Device: Soft collar Ortho Device/Splint Interventions: Ordered, Application, Adjustment   Post Interventions Patient Tolerated: Well Instructions Provided: Care of device, Adjustment of device   Karolee Stamps 01/28/2018, 2:32 PM

## 2018-01-28 NOTE — Discharge Summary (Signed)
Physician Discharge Summary  Patient ID: Drew Lopez MRN: [id number] DOB/AGE: 02-27-1960 57 y.o.  Admit date: 01/28/2018 Discharge date: 01/28/2018  Admission Diagnoses: Left C8 radiculopathy from cervical spondylosis and herniated nucleus pulposus C7-T1   Discharge Diagnoses: same   Discharged Condition: good  Hospital Course: The patient was admitted on 01/28/2018 and taken to the operating room where the patient underwent cervical lami and foraminotomy with microdiskectomy at C7-T1. The patient tolerated the procedure well and was taken to the recovery room and then to the floor in stable condition. The hospital course was routine. There were no complications. The wound remained clean dry and intact. Pt had appropriate neck soreness. No complaints of arm pain or new N/T/W. The patient remained afebrile with stable vital signs, and tolerated a regular diet. The patient continued to increase activities, and pain was well controlled with oral pain medications.   Consults: None  Significant Diagnostic Studies:  Results for orders placed or performed during the hospital encounter of 01/28/18  CBC  Result Value Ref Range   WBC 7.3 4.0 - 10.5 K/uL   RBC 5.04 4.22 - 5.81 MIL/uL   Hemoglobin 13.9 13.0 - 17.0 g/dL   HCT 44.7 39.0 - 52.0 %   MCV 88.7 80.0 - 100.0 fL   MCH 27.6 26.0 - 34.0 pg   MCHC 31.1 30.0 - 36.0 g/dL   RDW 12.6 11.5 - 15.5 %   Platelets 245 150 - 400 K/uL   nRBC 0.0 0.0 - 0.2 %  Type and screen [place]  Result Value Ref Range   ABO/RH(D) O POS    Antibody Screen NEG    Sample Expiration      01/31/2018 Performed at [facility], [place] [address] [place], [place] [address]   ABO/Rh  Result Value Ref Range   ABO/RH(D)      O POS Performed at [facility] [address] [place], [place] [address]     Dg Cervical Spine 1 View  Result Date: 01/28/2018 CLINICAL DATA:  C7-T1 laminectomy EXAM: DG C-ARM 61-120 MIN; DG  CERVICAL SPINE - 1 VIEW COMPARISON:  None. FLUOROSCOPY TIME:  Radiation Exposure Index (as provided by the fluoroscopic device): Not available If the device does not provide the exposure index: Fluoroscopy Time:  2 seconds Number of Acquired Images:  1 FINDINGS: Lateral view of the cervical spine reveals a surgical instrument adjacent to the C5-6 posterior elements. IMPRESSION: Intraoperative localization at C5-6. Electronically Signed   By: Inez Catalina M.D.   On: 01/28/2018 13:21   Dg C-arm 1-60 Min  Result Date: 01/28/2018 CLINICAL DATA:  C7-T1 laminectomy EXAM: DG C-ARM 61-120 MIN; DG CERVICAL SPINE - 1 VIEW COMPARISON:  None. FLUOROSCOPY TIME:  Radiation Exposure Index (as provided by the fluoroscopic device): Not available If the device does not provide the exposure index: Fluoroscopy Time:  2 seconds Number of Acquired Images:  1 FINDINGS: Lateral view of the cervical spine reveals a surgical instrument adjacent to the C5-6 posterior elements. IMPRESSION: Intraoperative localization at C5-6. Electronically Signed   By: Inez Catalina M.D.   On: 01/28/2018 13:21    Antibiotics:  Anti-infectives (From admission, onward)   Start     Dose/Rate Route Frequency Ordered Stop   01/28/18 1045  bacitracin 50,000 Units in sodium chloride 0.9 % 500 mL irrigation  Status:  Discontinued       As needed 01/28/18 1141 01/28/18 1300   01/28/18 1030  ceFAZolin (ANCEF)  IVPB 2g/100 mL premix     2 g 200 mL/hr over 30 Minutes Intravenous  Once 01/28/18 1018 01/28/18 1050      Discharge Exam: Blood pressure (!) 157/100, pulse 90, temperature (!) 97.2 F (36.2 C), resp. rate 20, height 6' (1.829 m), weight 111.1 kg, SpO2 98 %. Neurologic: Grossly normal Ambulating and voiding well  Discharge Medications:   Allergies as of 01/28/2018      Reactions   Promethazine Other (See Comments)   Makes pt VERY JITTERY      Medication List    TAKE these medications   ALLEGRA ALLERGY 180 MG tablet Generic drug:   fexofenadine Take 180 mg by mouth at bedtime.   aspirin 81 MG tablet Take 81 mg by mouth daily.   citalopram 20 MG tablet Commonly known as:  CELEXA Take 2 tablets (40 mg total) by mouth every evening. What changed:  when to take this   finasteride 5 MG tablet Commonly known as:  PROSCAR TAKE ONE TABLET BY MOUTH EVERY DAY   methocarbamol 500 MG tablet Commonly known as:  ROBAXIN Take 1 tablet (500 mg total) by mouth 4 (four) times daily.   Multiple Vitamin tablet Take 1 tablet by mouth daily.   omeprazole 20 MG capsule Commonly known as:  PRILOSEC TAKE 1 CAPSULE BY MOUTH EVERY DAY What changed:  how much to take   oxyCODONE 5 MG immediate release tablet Commonly known as:  ROXICODONE Take 1 tablet (5 mg total) by mouth every 6 (six) hours as needed.   traMADol 50 MG tablet Commonly known as:  ULTRAM Take 50 mg by mouth every 6 (six) hours as needed for moderate pain.       Disposition: home   Final Dx: lami/foraminotomy with microdiskectomy C7-T1       Signed: Ocie Cornfield Duncan Alejandro 01/28/2018, 1:45 PM

## 2018-01-28 NOTE — Transfer of Care (Signed)
Immediate Anesthesia Transfer of Care Note  Patient: Drew Lopez  Procedure(s) Performed: LEFT CERVICAL SEVEN  THORACIC ONE LAMENECTOMY/FORAMINOTOMY (N/A )  Patient Location: PACU  Anesthesia Type:General  Level of Consciousness: drowsy  Airway & Oxygen Therapy: Patient Spontanous Breathing and Patient connected to face mask oxygen  Post-op Assessment: Report given to RN and Post -op Vital signs reviewed and stable  Post vital signs: Reviewed and stable  Last Vitals:  Vitals Value Taken Time  BP 172/93 01/28/2018  1:07 PM  Temp    Pulse 102 01/28/2018  1:11 PM  Resp 17 01/28/2018  1:11 PM  SpO2 86 % 01/28/2018  1:11 PM  Vitals shown include unvalidated device data.  Last Pain:  Vitals:   01/28/18 0913  PainSc: 6       Patients Stated Pain Goal: 2 (61/44/31 5400)  Complications: No apparent anesthesia complications

## 2018-01-28 NOTE — Progress Notes (Signed)
Discharge instructions, RX's and follow up appts explained and provided to patient and c/g verbalized understanding. Patient left floor via wheelchair accompanied by staff.  Benedict Kue, Tivis Ringer, RN

## 2018-01-28 NOTE — Anesthesia Postprocedure Evaluation (Signed)
Anesthesia Post Note  Patient: Drew Lopez  Procedure(s) Performed: LEFT CERVICAL SEVEN  THORACIC ONE LAMENECTOMY/FORAMINOTOMY (N/A )     Patient location during evaluation: PACU Anesthesia Type: General Level of consciousness: awake and alert Pain management: pain level controlled Vital Signs Assessment: post-procedure vital signs reviewed and stable Respiratory status: spontaneous breathing, nonlabored ventilation and respiratory function stable Cardiovascular status: blood pressure returned to baseline and stable Postop Assessment: no apparent nausea or vomiting Anesthetic complications: no    Last Vitals:  Vitals:   01/28/18 1415 01/28/18 1435  BP:  125/81  Pulse:  (!) 113  Resp:  19  Temp: 36.6 C   SpO2:  91%    Last Pain:  Vitals:   01/28/18 1345  PainSc: 6                  Carolyn E Witman

## 2018-01-29 ENCOUNTER — Encounter (HOSPITAL_COMMUNITY): Payer: Self-pay | Admitting: Neurosurgery

## 2018-01-31 MED FILL — Thrombin For Soln 5000 Unit: CUTANEOUS | Qty: 5000 | Status: AC

## 2018-01-31 MED FILL — Gelatin Absorbable Sponge Size 50: CUTANEOUS | Qty: 1 | Status: AC

## 2018-01-31 MED FILL — Gelatin Absorbable Sponge Size 100: CUTANEOUS | Qty: 0.5 | Status: CN

## 2018-05-18 ENCOUNTER — Other Ambulatory Visit: Payer: Self-pay | Admitting: Family Medicine

## 2018-05-18 NOTE — Telephone Encounter (Signed)
yes

## 2018-05-18 NOTE — Telephone Encounter (Signed)
[facility] faxed refill request for the following medications:  omeprazole (PRILOSEC) 20 MG capsule     Please advise.  

## 2018-05-18 NOTE — Telephone Encounter (Signed)
Please review. Omeprazole interacts with Citalopram with QT elongation. Patient has taken both meds together for at least 1 year. Ok to send in?

## 2018-05-19 MED ORDER — OMEPRAZOLE 20 MG PO CPDR: 20.0000 mg | DELAYED_RELEASE_CAPSULE | Freq: Every day | ORAL | 3 refills | Status: DC

## 2018-11-12 ENCOUNTER — Other Ambulatory Visit: Payer: Self-pay | Admitting: Family Medicine

## 2018-11-12 DIAGNOSIS — F4322 Adjustment disorder with anxiety: Secondary | ICD-10-CM

## 2018-11-14 ENCOUNTER — Other Ambulatory Visit: Payer: Self-pay | Admitting: Family Medicine

## 2018-11-14 NOTE — Telephone Encounter (Signed)
[facility] faxed refill request for the following medications:  finasteride (PROSCAR) 5 MG tablet Qty: 90    Please advise.  Thanks, [employer]

## 2018-11-15 MED ORDER — FINASTERIDE 5 MG PO TABS: 5.0000 mg | ORAL_TABLET | Freq: Every day | ORAL | 3 refills | Status: DC

## 2018-12-28 ENCOUNTER — Other Ambulatory Visit: Payer: Self-pay

## 2018-12-28 DIAGNOSIS — Z20822 Contact with and (suspected) exposure to covid-19: Secondary | ICD-10-CM

## 2018-12-30 ENCOUNTER — Telehealth: Payer: Self-pay

## 2018-12-30 LAB — NOVEL CORONAVIRUS, NAA: SARS-CoV-2, NAA: NOT DETECTED

## 2018-12-30 NOTE — Telephone Encounter (Signed)
Called and LM of negative test results and for patient to call if they develop any symptoms or have any questions.

## 2018-12-30 NOTE — Telephone Encounter (Signed)
-----   Message from [name]., MD sent at 12/30/2018  8:41 AM EST ----- No covid detected.

## 2019-05-14 ENCOUNTER — Other Ambulatory Visit: Payer: Self-pay | Admitting: Family Medicine

## 2019-05-30 ENCOUNTER — Telehealth: Payer: Self-pay

## 2019-05-30 ENCOUNTER — Other Ambulatory Visit: Payer: Self-pay | Admitting: Family Medicine

## 2019-05-30 MED ORDER — OMEPRAZOLE 20 MG PO CPDR: 20.0000 mg | DELAYED_RELEASE_CAPSULE | Freq: Every day | ORAL | 0 refills | Status: DC

## 2019-05-30 NOTE — Telephone Encounter (Signed)
Patient requesting omeprazole (PRILOSEC) 20 MG capsule, informed please allow 48 to 72 hour turn around time. Patient states he's completely out and would like request sent in today  [place], [place] Phone:  [phone]  Fax:  [phone]

## 2019-05-30 NOTE — Telephone Encounter (Signed)
Attempted to call patient - left message to call back to schedule appointment. Courtesy RF given

## 2019-05-30 NOTE — Telephone Encounter (Signed)
Copied from [place] [phone]. Topic: General - Inquiry >> May 30, 2019  3:51 PM Virl Axe D wrote: Reason for CRM: Pt requesting callback from [facility] regarding Omeprazole. Please advise. CB#[phone]

## 2019-05-31 NOTE — Telephone Encounter (Signed)
LMTCB

## 2019-07-12 NOTE — Progress Notes (Signed)
Complete physical exam  I,Drew Lopez,acting as a scribe for Drew Durie, MD.,have documented all relevant documentation on the behalf of Drew Durie, MD,as directed by  Drew Durie, MD while in the presence of Drew Durie, MD.   Patient: Drew Lopez   DOB: September 03, 1960   59 y.o. Male  MRN: [id number] Visit Date: 07/18/2019  Today's healthcare provider: [name], MD   Chief Complaint  Patient presents with  . Annual Exam   Subjective    Drew Lopez is a 59 y.o. male who presents today for a complete physical exam.  He reports consuming a general diet. Home exercise routine includes walking. He generally feels well. He reports sleeping fairly well. He does have additional problems to discuss today.   HPI  Allergies Patient has a recurrence of mild symptoms of anal fissure would like to see Dr. [name] again.  Past Medical History:  Diagnosis Date  . Allergy   . Anxiety   . Arthritis    BACK-LOWER  . Cancer (HCC)    BASAL CELL  . Complication of anesthesia   . GERD (gastroesophageal reflux disease)   . History of kidney stones    h/o  . Low back pain   . PONV (postoperative nausea and vomiting)   . Sleep apnea    USES CPAP  . Vertigo    Past Surgical History:  Procedure Laterality Date  . ANAL FISTULOTOMY N/A 06/23/2017   Procedure: ANAL FISTULOTOMY;  Surgeon: Robert Bellow, MD;  Location: [facility];  Service: General;  Laterality: N/A;  . APPENDECTOMY    . COLONOSCOPY  2016   Dr [name]  . HERNIA REPAIR Bilateral    10  . POSTERIOR CERVICAL LAMINECTOMY N/A 01/28/2018   Procedure: LEFT CERVICAL SEVEN  THORACIC ONE LAMENECTOMY/FORAMINOTOMY;  Surgeon: [name], MD;  Location: [place];  Service: Neurosurgery;  Laterality: N/A;  . SHOULDER ARTHROSCOPY    . TONSILLECTOMY     Social History   Socioeconomic History  . Marital status: Divorced    Spouse name: Not on file  . Number of children: Not on file  . Years  of education: Not on file  . Highest education level: Not on file  Occupational History  . Not on file  Tobacco Use  . Smoking status: Never Smoker  . Smokeless tobacco: Never Used  Substance and Sexual Activity  . Alcohol use: No  . Drug use: No  . Sexual activity: Not on file  Other Topics Concern  . Not on file  Social History Narrative  . Not on file   Social Determinants of Health   Financial Resource Strain:   . Difficulty of Paying Living Expenses:   Food Insecurity:   . Worried About Charity fundraiser in the Last Year:   . Arboriculturist in the Last Year:   Transportation Needs:   . Film/video editor (Medical):   [name] Lack of Transportation (Non-Medical):   Physical Activity:   . Days of Exercise per Week:   . Minutes of Exercise per Session:   Stress:   . Feeling of Stress :   Social Connections:   . Frequency of Communication with Friends and Family:   . Frequency of Social Gatherings with Friends and Family:   . Attends Religious Services:   . Active Member of Clubs or Organizations:   . Attends Archivist Meetings:   [name] Marital Status:  Intimate Partner Violence:   . Fear of Current or Ex-Partner:   . Emotionally Abused:   [name] Physically Abused:   . Sexually Abused:    Family Status  Relation Name Status  . Father  Deceased  . Mother  Deceased at age 41       cause of death was old age  . Sister  Alive  . Brother  Alive  . Brother  Alive  . Brother  Alive   Family History  Problem Relation Age of Onset  . Alzheimer's disease Father    Allergies  Allergen Reactions  . Promethazine Other (See Comments)    Makes pt VERY JITTERY    Patient Care Team: [name]., MD as PCP - General (Family Medicine) [name], [name], MD (General Surgery) [name]., MD (Family Medicine)   Medications: Outpatient Medications Prior to Visit  Medication Sig  . aspirin 81 MG tablet Take 81 mg by mouth daily.   .  citalopram (CELEXA) 20 MG tablet Take 2 tablets (40 mg total) by mouth daily with lunch.  . fexofenadine (ALLEGRA ALLERGY) 180 MG tablet Take 180 mg by mouth at bedtime.   . finasteride (PROSCAR) 5 MG tablet Take 1 tablet (5 mg total) by mouth daily.  . Multiple Vitamin tablet Take 1 tablet by mouth daily.   [name] omeprazole (PRILOSEC) 20 MG capsule Take 1 capsule (20 mg total) by mouth daily.  . methocarbamol (ROBAXIN) 500 MG tablet Take 1 tablet (500 mg total) by mouth 4 (four) times daily. (Patient not taking: Reported on 07/18/2019)  . traMADol (ULTRAM) 50 MG tablet Take 50 mg by mouth every 6 (six) hours as needed for moderate pain.   No facility-administered medications prior to visit.    Review of Systems  Constitutional: Negative.   Eyes: Negative.   Respiratory: Negative.   Cardiovascular: Negative.   Gastrointestinal: Positive for rectal pain.  Endocrine: Negative.   Genitourinary: Negative.   Musculoskeletal: Positive for arthralgias.  Skin: Negative.   Allergic/Immunologic: Positive for environmental allergies.  Neurological: Negative.   Hematological: Negative.   Psychiatric/Behavioral: Negative.   All other systems reviewed and are negative.      Objective    BP 131/89 (BP Location: Left Arm, Patient Position: Sitting, Cuff Size: Large)   Pulse 76   Temp (!) 97.3 F (36.3 C) (Other (Comment))   Resp 16   Ht 6' (1.829 m)   Wt 248 lb (112.5 kg)   SpO2 96%   BMI 33.63 kg/m  BP Readings from Last 3 Encounters:  07/18/19 131/89  01/28/18 109/75  09/14/17 124/84   Wt Readings from Last 3 Encounters:  07/18/19 248 lb (112.5 kg)  01/28/18 245 lb (111.1 kg)  09/14/17 244 lb (110.7 kg)      Physical Exam Vitals and nursing note reviewed. Exam conducted with a chaperone present.  Constitutional:      Appearance: Normal appearance. He is normal weight.  HENT:     Right Ear: Tympanic membrane normal.     Left Ear: Tympanic membrane normal.     Nose: Nose normal.      Mouth/Throat:     Mouth: Mucous membranes are moist.  Cardiovascular:     Rate and Rhythm: Normal rate and regular rhythm.     Pulses: Normal pulses.     Heart sounds: Normal heart sounds.  Pulmonary:     Effort: Pulmonary effort is normal.     Breath sounds: Normal breath sounds.  Abdominal:     General: Bowel sounds are normal.     Palpations: Abdomen is soft.  Genitourinary:    Penis: Normal.      Testes: Normal.     Comments: Small anal fissure visualized. Musculoskeletal:     Cervical back: Normal range of motion and neck supple.  Skin:    General: Skin is warm and dry.     Comments: Some AK's of face and forearm  Neurological:     General: No focal deficit present.     Mental Status: He is alert and oriented to person, place, and time.  Psychiatric:        Mood and Affect: Mood normal.        Behavior: Behavior normal.       Depression Screen  PHQ 2/9 Scores 09/08/2017 07/16/2015  PHQ - 2 Score 2 2  PHQ- 9 Score 5 7    No results found for any visits on 07/18/19.  Assessment & Plan    Routine Health Maintenance and Physical Exam  Exercise Activities and Dietary recommendations Goals   None     Immunization History  Administered Date(s) Administered  . Influenza,inj,Quad PF,6+ Mos 12/03/2014, 12/20/2015  . Influenza-Unspecified 12/18/2016  . Tdap 11/25/2006    Health Maintenance  Topic Date Due  . COVID-19 Vaccine (1) Never done  . HIV Screening  Never done  . TETANUS/TDAP  11/24/2016  . INFLUENZA VACCINE  09/17/2019  . COLONOSCOPY  12/23/2022  . Hepatitis C Screening  Addressed    Discussed health benefits of physical activity, and encouraged him to engage in regular exercise appropriate for his age and condition.  1. Annual physical exam Lifestyle discussed. - Lipid panel - CBC w/Diff/Platelet - Comprehensive Metabolic Panel (CMET) - TSH - POCT urinalysis dipstick  2. Hyperlipidemia, unspecified hyperlipidemia type  - Lipid  panel - CBC w/Diff/Platelet - Comprehensive Metabolic Panel (CMET) - TSH  3. Gastroesophageal reflux disease, unspecified whether esophagitis present  - Lipid panel - CBC w/Diff/Platelet - Comprehensive Metabolic Panel (CMET) - TSH  4. Prostate cancer screening  - PSA - POCT urinalysis dipstick  5. AK (actinic keratosis)  - Ambulatory referral to Dermatology  6. Screening for colon cancer Could use nitroglycerin paste but patient wishes to see Dr. [name]. - Ambulatory referral to Gastroenterology   Return in about 1 year (around 07/17/2020).     I, Drew Durie, MD, have reviewed all documentation for this visit. The documentation on 07/22/19 for the exam, diagnosis, procedures, and orders are all accurate and complete.    Adrian Specht Cranford Mon, MD  [facility] [phone] (phone) [phone] (fax)  [place]

## 2019-07-18 ENCOUNTER — Ambulatory Visit (INDEPENDENT_AMBULATORY_CARE_PROVIDER_SITE_OTHER): Payer: 59 | Admitting: Family Medicine

## 2019-07-18 ENCOUNTER — Encounter: Payer: Self-pay | Admitting: Family Medicine

## 2019-07-18 ENCOUNTER — Other Ambulatory Visit: Payer: Self-pay

## 2019-07-18 VITALS — BP 131/89 | HR 76 | Temp 97.3°F | Resp 16 | Ht 72.0 in | Wt 248.0 lb

## 2019-07-18 DIAGNOSIS — E785 Hyperlipidemia, unspecified: Secondary | ICD-10-CM | POA: Diagnosis not present

## 2019-07-18 DIAGNOSIS — K219 Gastro-esophageal reflux disease without esophagitis: Secondary | ICD-10-CM

## 2019-07-18 DIAGNOSIS — Z125 Encounter for screening for malignant neoplasm of prostate: Secondary | ICD-10-CM | POA: Diagnosis not present

## 2019-07-18 DIAGNOSIS — Z0000 Encounter for general adult medical examination without abnormal findings: Secondary | ICD-10-CM

## 2019-07-18 DIAGNOSIS — L570 Actinic keratosis: Secondary | ICD-10-CM

## 2019-07-18 DIAGNOSIS — Z1211 Encounter for screening for malignant neoplasm of colon: Secondary | ICD-10-CM

## 2019-07-18 LAB — POCT URINALYSIS DIPSTICK
Appearance: NORMAL
Bilirubin, UA: NEGATIVE
Blood, UA: NEGATIVE
Glucose, UA: NEGATIVE
Ketones, UA: NEGATIVE
Leukocytes, UA: NEGATIVE
Nitrite, UA: NEGATIVE
Odor: NORMAL
Protein, UA: NEGATIVE
Spec Grav, UA: 1.02 (ref 1.010–1.025)
Urobilinogen, UA: 0.2 E.U./dL
pH, UA: 5 (ref 5.0–8.0)

## 2019-07-19 LAB — COMPREHENSIVE METABOLIC PANEL
ALT: 24 IU/L (ref 0–44)
AST: 19 IU/L (ref 0–40)
Albumin/Globulin Ratio: 1.5 (ref 1.2–2.2)
Albumin: 4.7 g/dL (ref 3.8–4.9)
Alkaline Phosphatase: 111 IU/L (ref 48–121)
BUN/Creatinine Ratio: 14 (ref 9–20)
BUN: 14 mg/dL (ref 6–24)
Bilirubin Total: 0.6 mg/dL (ref 0.0–1.2)
CO2: 23 mmol/L (ref 20–29)
Calcium: 9.8 mg/dL (ref 8.7–10.2)
Chloride: 100 mmol/L (ref 96–106)
Creatinine, Ser: 0.97 mg/dL (ref 0.76–1.27)
GFR calc Af Amer: 99 mL/min/{1.73_m2} (ref >59)
GFR calc non Af Amer: 86 mL/min/{1.73_m2} (ref >59)
Globulin, Total: 3.1 g/dL (ref 1.5–4.5)
Glucose: 97 mg/dL (ref 65–99)
Potassium: 4.3 mmol/L (ref 3.5–5.2)
Sodium: 139 mmol/L (ref 134–144)
Total Protein: 7.8 g/dL (ref 6.0–8.5)

## 2019-07-19 LAB — TSH: TSH: 3.53 u[IU]/mL (ref 0.450–4.500)

## 2019-07-19 LAB — CBC WITH DIFFERENTIAL/PLATELET
Basophils Absolute: 0.1 10*3/uL (ref 0.0–0.2)
Basos: 1 %
EOS (ABSOLUTE): 0.3 10*3/uL (ref 0.0–0.4)
Eos: 3 %
Hematocrit: 46.1 % (ref 37.5–51.0)
Hemoglobin: 14.9 g/dL (ref 13.0–17.7)
Immature Grans (Abs): 0 10*3/uL (ref 0.0–0.1)
Immature Granulocytes: 0 %
Lymphocytes Absolute: 3.2 10*3/uL — ABNORMAL HIGH (ref 0.7–3.1)
Lymphs: 32 %
MCH: 28 pg (ref 26.6–33.0)
MCHC: 32.3 g/dL (ref 31.5–35.7)
MCV: 87 fL (ref 79–97)
Monocytes Absolute: 1 10*3/uL — ABNORMAL HIGH (ref 0.1–0.9)
Monocytes: 10 %
Neutrophils Absolute: 5.2 10*3/uL (ref 1.4–7.0)
Neutrophils: 54 %
Platelets: 307 10*3/uL (ref 150–450)
RBC: 5.33 x10E6/uL (ref 4.14–5.80)
RDW: 12.3 % (ref 11.6–15.4)
WBC: 9.8 10*3/uL (ref 3.4–10.8)

## 2019-07-19 LAB — PSA: Prostate Specific Ag, Serum: 0.5 ng/mL (ref 0.0–4.0)

## 2019-07-22 ENCOUNTER — Encounter: Payer: Self-pay | Admitting: Family Medicine

## 2019-07-24 ENCOUNTER — Telehealth: Payer: Self-pay

## 2019-07-24 NOTE — Telephone Encounter (Signed)
Patient advised of lab results through mychart and lipid test was able to be added through [facility]. I advised patient that his lab results should be resulted by Wednesday at the latest.

## 2019-07-24 NOTE — Telephone Encounter (Signed)
-----   Message from [name]., MD sent at 07/21/2019 10:19 AM EDT ----- Labs in normal range.

## 2019-08-15 LAB — LIPID PANEL W/O CHOL/HDL RATIO
Cholesterol, Total: 229 mg/dL — ABNORMAL HIGH (ref 100–199)
HDL: 34 mg/dL — ABNORMAL LOW (ref >39)
LDL Chol Calc (NIH): 142 mg/dL — ABNORMAL HIGH (ref 0–99)
Triglycerides: 293 mg/dL — ABNORMAL HIGH (ref 0–149)
VLDL Cholesterol Cal: 53 mg/dL — ABNORMAL HIGH (ref 5–40)

## 2019-08-15 LAB — SPECIMEN STATUS REPORT

## 2019-08-16 ENCOUNTER — Telehealth: Payer: Self-pay

## 2019-08-16 NOTE — Telephone Encounter (Signed)
-----   Message from [name]., MD sent at 08/16/2019  8:02 AM EDT ----- Lipids moderately high.  Work on diet and exercise as we discussed.

## 2019-08-16 NOTE — Telephone Encounter (Signed)
Patient advised of lab results and also advised that Dr. Rosanna Randy sent his lab results through [facility] as a message.

## 2019-08-18 ENCOUNTER — Other Ambulatory Visit: Payer: Self-pay | Admitting: Family Medicine

## 2019-08-26 ENCOUNTER — Other Ambulatory Visit: Payer: Self-pay | Admitting: Family Medicine

## 2019-08-26 NOTE — Telephone Encounter (Signed)
Requested Prescriptions  Pending Prescriptions Disp Refills  . omeprazole (PRILOSEC) 20 MG capsule [Pharmacy Med Name: OMEPRAZOLE 20MG  CAPSULES] 90 capsule 0    Sig: TAKE 1 CAPSULE(20 MG) BY MOUTH DAILY     Gastroenterology: Proton Pump Inhibitors Passed - 08/26/2019  5:35 PM      Passed - Valid encounter within last 12 months    Recent Outpatient Visits          1 month ago Annual physical exam   [facility] [name]., MD   1 year ago Annual physical exam   [facility] [name]., MD   2 years ago Adjustment disorder with anxiety   [facility] [name]., MD   2 years ago Viral upper respiratory tract infection   [facility] [place], [place], [place]   3 years ago Abscess, perianal   [facility] [name]., MD      Future Appointments            In 10 months [name]., MD [facility], [place]

## 2019-11-09 IMAGING — RF DG CERVICAL SPINE 1V
1 series · 1 of 1 positions shown · non-contrast
Comparison: None.

CLINICAL DATA: C7-T1 laminectomy

EXAM:
DG C-ARM 61-120 MIN; DG CERVICAL SPINE - 1 VIEW

[Series 1: run · 1 of 1 slices shown]
[im 1/1]
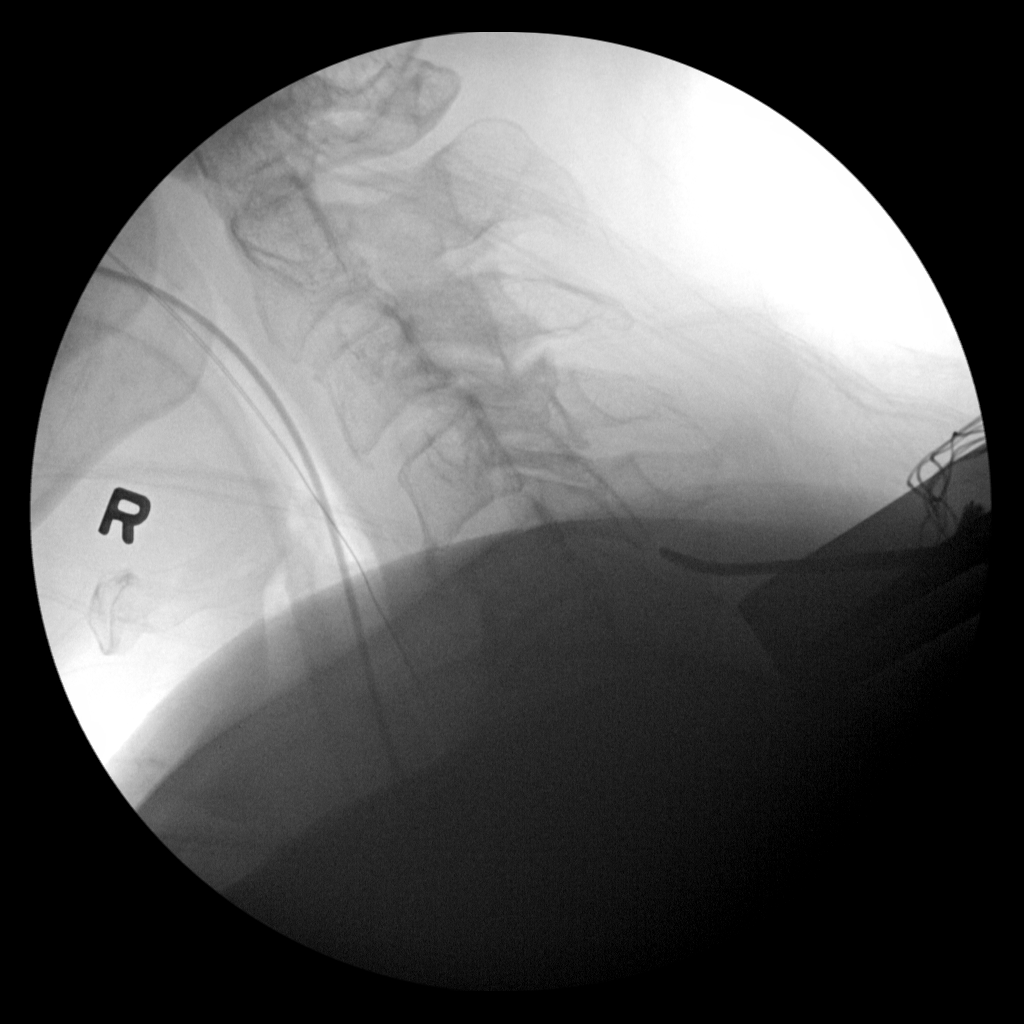

[1 of 1 positions shown; findings below may reference images not displayed]

FLUOROSCOPY TIME:  Radiation Exposure Index (as provided by the
fluoroscopic device): Not available

If the device does not provide the exposure index:

Fluoroscopy Time:  2 seconds

Number of Acquired Images:  1
FINDINGS: Lateral view of the cervical spine reveals a surgical instrument
adjacent to the C5-6 posterior elements.
IMPRESSION: Intraoperative localization at C5-6.

## 2019-11-24 ENCOUNTER — Other Ambulatory Visit: Payer: Self-pay | Admitting: Family Medicine

## 2019-11-24 DIAGNOSIS — F4322 Adjustment disorder with anxiety: Secondary | ICD-10-CM

## 2020-02-21 ENCOUNTER — Other Ambulatory Visit: Payer: Self-pay | Admitting: Family Medicine

## 2020-05-18 ENCOUNTER — Other Ambulatory Visit: Payer: Self-pay | Admitting: Family Medicine

## 2020-05-18 NOTE — Telephone Encounter (Signed)
Requested Prescriptions  Pending Prescriptions Disp Refills  . omeprazole (PRILOSEC) 20 MG capsule [Pharmacy Med Name: OMEPRAZOLE 20MG  CAPSULES] 90 capsule 0    Sig: TAKE 1 CAPSULE(20 MG) BY MOUTH DAILY     Gastroenterology: Proton Pump Inhibitors Passed - 05/18/2020 10:39 AM      Passed - Valid encounter within last 12 months    Recent Outpatient Visits          10 months ago Annual physical exam   [facility] [name]., MD   2 years ago Annual physical exam   [facility] [name]., MD   3 years ago Adjustment disorder with anxiety   [facility] [name]., MD   3 years ago Viral upper respiratory tract infection   [facility] [place], [place], [place]   4 years ago Abscess, perianal   [facility] [name]., MD      Future Appointments            In 4 months [name]., MD [facility], [place]

## 2020-05-19 ENCOUNTER — Other Ambulatory Visit: Payer: Self-pay | Admitting: Family Medicine

## 2020-07-18 ENCOUNTER — Encounter: Payer: Self-pay | Admitting: Family Medicine

## 2020-09-03 ENCOUNTER — Other Ambulatory Visit: Payer: Self-pay | Admitting: *Deleted

## 2020-09-03 MED ORDER — OMEPRAZOLE 20 MG PO CPDR: DELAYED_RELEASE_CAPSULE | ORAL | 0 refills | Status: DC

## 2020-10-14 ENCOUNTER — Other Ambulatory Visit: Payer: Self-pay

## 2020-10-14 ENCOUNTER — Encounter: Payer: Self-pay | Admitting: Family Medicine

## 2020-10-14 ENCOUNTER — Ambulatory Visit (INDEPENDENT_AMBULATORY_CARE_PROVIDER_SITE_OTHER): Payer: No Typology Code available for payment source | Admitting: Family Medicine

## 2020-10-14 VITALS — BP 130/74 | HR 74 | Temp 98.6°F | Resp 16 | Ht 72.0 in | Wt 238.0 lb

## 2020-10-14 DIAGNOSIS — Z0000 Encounter for general adult medical examination without abnormal findings: Secondary | ICD-10-CM | POA: Diagnosis not present

## 2020-10-14 DIAGNOSIS — Z23 Encounter for immunization: Secondary | ICD-10-CM

## 2020-10-14 DIAGNOSIS — Z1389 Encounter for screening for other disorder: Secondary | ICD-10-CM

## 2020-10-14 DIAGNOSIS — R739 Hyperglycemia, unspecified: Secondary | ICD-10-CM

## 2020-10-14 DIAGNOSIS — N529 Male erectile dysfunction, unspecified: Secondary | ICD-10-CM

## 2020-10-14 DIAGNOSIS — F4322 Adjustment disorder with anxiety: Secondary | ICD-10-CM

## 2020-10-14 DIAGNOSIS — E785 Hyperlipidemia, unspecified: Secondary | ICD-10-CM

## 2020-10-14 DIAGNOSIS — Z125 Encounter for screening for malignant neoplasm of prostate: Secondary | ICD-10-CM | POA: Diagnosis not present

## 2020-10-14 DIAGNOSIS — K219 Gastro-esophageal reflux disease without esophagitis: Secondary | ICD-10-CM

## 2020-10-14 DIAGNOSIS — L570 Actinic keratosis: Secondary | ICD-10-CM

## 2020-10-14 MED ORDER — TADALAFIL 20 MG PO TABS: 10.0000 mg | ORAL_TABLET | ORAL | 5 refills | Status: DC | PRN

## 2020-10-14 MED ORDER — CITALOPRAM HYDROBROMIDE 20 MG PO TABS: 20.0000 mg | ORAL_TABLET | Freq: Every day | ORAL | 3 refills | Status: DC

## 2020-10-14 MED ORDER — OMEPRAZOLE 20 MG PO CPDR: 20.0000 mg | DELAYED_RELEASE_CAPSULE | Freq: Every day | ORAL | 3 refills | Status: DC

## 2020-10-14 NOTE — Progress Notes (Signed)
Complete physical exam   Patient: Drew Lopez   DOB: [birth year]   60 y.o. Male  MRN: [id number] Visit Date: 10/14/2020  Today's healthcare provider: [name], MD   Chief Complaint  Patient presents with   Annual Exam   Subjective    Drew Lopez is a 60 y.o. male who presents today for a complete physical exam.  He reports consuming a general diet. The patient does not participate in regular exercise at present. He generally feels fairly well. He reports sleeping fairly well. He does not have additional problems to discuss today.  Patient is now dating someone regularly.  Is a physical therapist.  The only son  is now married and expecting his first child.  Past Medical History:  Diagnosis Date   Allergy    Anxiety    Arthritis    BACK-LOWER   Cancer ([place])    BASAL CELL   Complication of anesthesia    GERD (gastroesophageal reflux disease)    History of kidney stones    h/o   Low back pain    PONV (postoperative nausea and vomiting)    Sleep apnea    USES CPAP   Vertigo    Past Surgical History:  Procedure Laterality Date   ANAL FISTULOTOMY N/A 06/23/2017   Procedure: ANAL FISTULOTOMY;  Surgeon: Robert Bellow, MD;  Location: [facility];  Service: General;  Laterality: N/A;   APPENDECTOMY     COLONOSCOPY  2016   Dr [name]   HERNIA REPAIR Bilateral    [place] N/A 01/28/2018   Procedure: LEFT CERVICAL SEVEN  THORACIC ONE LAMENECTOMY/FORAMINOTOMY;  Surgeon: [name], MD;  Location: [place];  Service: Neurosurgery;  Laterality: N/A;   SHOULDER ARTHROSCOPY     TONSILLECTOMY     Social History   Socioeconomic History   Marital status: Divorced    Spouse name: Not on file   Number of children: Not on file   Years of education: Not on file   Highest education level: Not on file  Occupational History   Not on file  Tobacco Use   Smoking status: Never   Smokeless tobacco: Never  Vaping Use   Vaping Use: Never used   Substance and Sexual Activity   Alcohol use: No   Drug use: No   Sexual activity: Not on file  Other Topics Concern   Not on file  Social History Narrative   Not on file   Social Determinants of Health   Financial Resource Strain: Not on file  Food Insecurity: Not on file  Transportation Needs: Not on file  Physical Activity: Not on file  Stress: Not on file  Social Connections: Not on file  Intimate Partner Violence: Not on file   Family Status  Relation Name Status   Father  Deceased   Mother  Deceased at age 66       cause of death was old age   Sister  Alive   Brother  Alive   Brother  Alive   Brother  Alive   Family History  Problem Relation Age of Onset   Alzheimer's disease Father    Allergies  Allergen Reactions   Promethazine Other (See Comments)    Makes pt VERY JITTERY    Patient Care Team: [name]., MD as PCP - General (Family Medicine) [name], [name], MD (General Surgery) [name]., MD (Family Medicine)   Medications:  Outpatient Medications Prior to Visit  Medication Sig   aspirin 81 MG tablet Take 81 mg by mouth daily.    citalopram (CELEXA) 20 MG tablet TAKE 2 TABLETS(40 MG) BY MOUTH DAILY WITH LUNCH   fexofenadine (ALLEGRA) 180 MG tablet Take 180 mg by mouth at bedtime.    finasteride (PROSCAR) 5 MG tablet TAKE 1 TABLET(5 MG) BY MOUTH DAILY   Multiple Vitamin tablet Take 1 tablet by mouth daily.    omeprazole (PRILOSEC) 20 MG capsule TAKE 1 CAPSULE(20 MG) BY MOUTH DAILY   methocarbamol (ROBAXIN) 500 MG tablet Take 1 tablet (500 mg total) by mouth 4 (four) times daily. (Patient not taking: No sig reported)   traMADol (ULTRAM) 50 MG tablet Take 50 mg by mouth every 6 (six) hours as needed for moderate pain. (Patient not taking: Reported on 10/14/2020)   No facility-administered medications prior to visit.    Review of Systems  All other systems reviewed and are negative.     Objective    BP 130/74   Pulse  74   Temp 98.6 F (37 C)   Resp 16   Ht 6' (1.829 m)   Wt 238 lb (108 kg)   BMI 32.28 kg/m  BP Readings from Last 3 Encounters:  10/14/20 130/74  07/18/19 131/89  01/28/18 109/75   Wt Readings from Last 3 Encounters:  10/14/20 238 lb (108 kg)  07/18/19 248 lb (112.5 kg)  01/28/18 245 lb (111.1 kg)      Physical Exam Vitals and nursing note reviewed. Exam conducted with a chaperone present.  Constitutional:      Appearance: Normal appearance. He is normal weight.  HENT:     Right Ear: Tympanic membrane normal.     Left Ear: Tympanic membrane normal.     Nose: Nose normal.     Mouth/Throat:     Mouth: Mucous membranes are moist.  Cardiovascular:     Rate and Rhythm: Normal rate and regular rhythm.     Pulses: Normal pulses.     Heart sounds: Normal heart sounds.  Pulmonary:     Effort: Pulmonary effort is normal.     Breath sounds: Normal breath sounds.  Abdominal:     General: Bowel sounds are normal.     Palpations: Abdomen is soft.  Musculoskeletal:     Cervical back: Normal range of motion and neck supple.  Skin:    General: Skin is warm and dry.     Comments: Some AK's of face and forearm  Neurological:     General: No focal deficit present.     Mental Status: He is alert and oriented to person, place, and time.  Psychiatric:        Mood and Affect: Mood normal.        Behavior: Behavior normal.        Thought Content: Thought content normal.        Judgment: Judgment normal.      Last depression screening scores PHQ 2/9 Scores 09/08/2017 07/16/2015  PHQ - 2 Score 2 2  PHQ- 9 Score 5 7   Last fall risk screening Fall Risk  09/08/2017  Falls in the past year? No   Last Audit-C alcohol use screening Alcohol Use Disorder Test (AUDIT) 07/18/2019  1. How often do you have a drink containing alcohol? 0  2. How many drinks containing alcohol do you have on a typical day when you are drinking? 0  3. How often do you have six  or more drinks on one occasion? 0   AUDIT-C Score 0   A score of 3 or more in women, and 4 or more in men indicates increased risk for alcohol abuse, EXCEPT if all of the points are from question 1   No results found for any visits on 10/14/20.  Assessment & Plan    Routine Health Maintenance and Physical Exam  Exercise Activities and Dietary recommendations  Goals   None     Immunization History  Administered Date(s) Administered   Influenza,inj,Quad PF,6+ Mos 12/03/2014, 12/20/2015   Influenza-Unspecified 12/18/2016   Tdap 11/25/2006    Health Maintenance  Topic Date Due   COVID-19 Vaccine (1) Never done   Pneumococcal Vaccine 82-18 Years old (1 - PCV) Never done   HIV Screening  Never done   Zoster Vaccines- Shingrix (1 of 2) Never done   TETANUS/TDAP  11/24/2016   INFLUENZA VACCINE  09/16/2020   COLONOSCOPY (Pts 45-62yr Insurance coverage will need to be confirmed)  12/23/2022   Hepatitis C Screening  Addressed   HPV VACCINES  Aged Out    Discussed health benefits of physical activity, and encouraged him to engage in regular exercise appropriate for his age and condition.  1. Annual physical exam   2. Hyperlipidemia, unspecified hyperlipidemia type  - Lipid panel - Comprehensive Metabolic Panel (CMET)  3. Gastroesophageal reflux disease, unspecified whether esophagitis present  - TSH - CBC w/Diff/Platelet  4. Prostate cancer screening  - PSA  5. Screening for blood or protein in urine  - POCT urinalysis dipstick  6. Hyperglycemia  - Hemoglobin A1c  7. Adjustment disorder with anxiety continue - citalopram (CELEXA) 20 MG tablet; Take 1 tablet (20 mg total) by mouth daily.  Dispense: 180 tablet; Refill: 3  8. AK (actinic keratosis)  - Ambulatory referral to Dermatology  9. Erectile dysfunction, unspecified erectile dysfunction type  - tadalafil (CIALIS) 20 MG tablet; Take 0.5-1 tablets (10-20 mg total) by mouth every other day as needed for erectile dysfunction.  Dispense: 50  tablet; Refill: 5  10. Need for Tdap vaccination  - Tdap vaccine greater than or equal to 7yo IM   No follow-ups on file.     I, RWilhemena Durie MD, have reviewed all documentation for this visit. The documentation on 10/19/20 for the exam, diagnosis, procedures, and orders are all accurate and complete.    Jammie Clink GCranford Mon MD  [phone](phone) [phone](fax)  [place]

## 2020-10-15 LAB — COMPREHENSIVE METABOLIC PANEL
ALT: 20 IU/L (ref 0–44)
AST: 15 IU/L (ref 0–40)
Albumin/Globulin Ratio: 1.6 (ref 1.2–2.2)
Albumin: 4.5 g/dL (ref 3.8–4.9)
Alkaline Phosphatase: 114 IU/L (ref 44–121)
BUN/Creatinine Ratio: 12 (ref 9–20)
BUN: 13 mg/dL (ref 6–24)
Bilirubin Total: 0.5 mg/dL (ref 0.0–1.2)
CO2: 26 mmol/L (ref 20–29)
Calcium: 9.7 mg/dL (ref 8.7–10.2)
Chloride: 102 mmol/L (ref 96–106)
Creatinine, Ser: 1.07 mg/dL (ref 0.76–1.27)
Globulin, Total: 2.8 g/dL (ref 1.5–4.5)
Glucose: 109 mg/dL — ABNORMAL HIGH (ref 65–99)
Potassium: 4.7 mmol/L (ref 3.5–5.2)
Sodium: 141 mmol/L (ref 134–144)
Total Protein: 7.3 g/dL (ref 6.0–8.5)
eGFR: 80 mL/min/{1.73_m2} (ref >59)

## 2020-10-15 LAB — LIPID PANEL
Chol/HDL Ratio: 5.8 ratio — ABNORMAL HIGH (ref 0.0–5.0)
Cholesterol, Total: 209 mg/dL — ABNORMAL HIGH (ref 100–199)
HDL: 36 mg/dL — ABNORMAL LOW (ref >39)
LDL Chol Calc (NIH): 136 mg/dL — ABNORMAL HIGH (ref 0–99)
Triglycerides: 205 mg/dL — ABNORMAL HIGH (ref 0–149)
VLDL Cholesterol Cal: 37 mg/dL (ref 5–40)

## 2020-10-15 LAB — CBC WITH DIFFERENTIAL/PLATELET
Basophils Absolute: 0.1 10*3/uL (ref 0.0–0.2)
Basos: 1 %
EOS (ABSOLUTE): 0.3 10*3/uL (ref 0.0–0.4)
Eos: 3 %
Hematocrit: 45.3 % (ref 37.5–51.0)
Hemoglobin: 15 g/dL (ref 13.0–17.7)
Immature Grans (Abs): 0 10*3/uL (ref 0.0–0.1)
Immature Granulocytes: 0 %
Lymphocytes Absolute: 2.9 10*3/uL (ref 0.7–3.1)
Lymphs: 37 %
MCH: 29.1 pg (ref 26.6–33.0)
MCHC: 33.1 g/dL (ref 31.5–35.7)
MCV: 88 fL (ref 79–97)
Monocytes Absolute: 0.8 10*3/uL (ref 0.1–0.9)
Monocytes: 11 %
Neutrophils Absolute: 3.7 10*3/uL (ref 1.4–7.0)
Neutrophils: 48 %
Platelets: 275 10*3/uL (ref 150–450)
RBC: 5.16 x10E6/uL (ref 4.14–5.80)
RDW: 12 % (ref 11.6–15.4)
WBC: 7.7 10*3/uL (ref 3.4–10.8)

## 2020-10-15 LAB — PSA: Prostate Specific Ag, Serum: 0.5 ng/mL (ref 0.0–4.0)

## 2020-10-15 LAB — HEMOGLOBIN A1C
Est. average glucose Bld gHb Est-mCnc: 131 mg/dL
Hgb A1c MFr Bld: 6.2 % — ABNORMAL HIGH (ref 4.8–5.6)

## 2020-10-15 LAB — TSH: TSH: 2.63 u[IU]/mL (ref 0.450–4.500)

## 2020-11-24 ENCOUNTER — Other Ambulatory Visit: Payer: Self-pay | Admitting: Family Medicine

## 2020-11-24 DIAGNOSIS — F4322 Adjustment disorder with anxiety: Secondary | ICD-10-CM

## 2020-11-24 NOTE — Telephone Encounter (Signed)
Last RF 10/14/20 #180 3 RF (Celexa) Requested Prescriptions  Pending Prescriptions Disp Refills  . finasteride (PROSCAR) 5 MG tablet [Pharmacy Med Name: FINASTERIDE 5MG  TABLETS] 90 tablet 3    Sig: TAKE 1 TABLET(5 MG) BY MOUTH DAILY     Urology: 5-alpha Reductase Inhibitors Passed - 11/24/2020  1:15 PM      Passed - Valid encounter within last 12 months    Recent Outpatient Visits          1 month ago Annual physical exam   [facility] [name]., MD   1 year ago Annual physical exam   [facility] [name]., MD   3 years ago Annual physical exam   [facility] [name]., MD   3 years ago Adjustment disorder with anxiety   [facility] [name]., MD   3 years ago Viral upper respiratory tract infection   [facility] [place], [place], [place]      Future Appointments            In 10 months [name]., MD [facility]           . citalopram (CELEXA) 20 MG tablet [Pharmacy Med Name: CITALOPRAM 20MG  TABLETS] 180 tablet     Sig: TAKE 2 TABLETS(40 MG) BY MOUTH DAILY WITH LUNCH     Psychiatry:  Antidepressants - SSRI Passed - 11/24/2020  1:15 PM      Passed - Completed PHQ-2 or PHQ-9 in the last 360 days      Passed - Valid encounter within last 6 months    Recent Outpatient Visits          1 month ago Annual physical exam   [facility] [name]., MD   1 year ago Annual physical exam   [facility] [name]., MD   3 years ago Annual physical exam   [facility] [name]., MD   3 years ago Adjustment disorder with anxiety   [facility] [name]., MD   3 years ago Viral upper respiratory tract infection   [facility] [place], [place], [place]      Future Appointments            In 10 months [name]., MD [facility], [place]

## 2020-11-24 NOTE — Telephone Encounter (Signed)
Last RF Celexa 10/14/20 #180 3 RF

## 2021-01-21 ENCOUNTER — Ambulatory Visit: Payer: Self-pay | Admitting: *Deleted

## 2021-01-21 ENCOUNTER — Other Ambulatory Visit: Payer: Self-pay | Admitting: Family Medicine

## 2021-01-21 DIAGNOSIS — F4322 Adjustment disorder with anxiety: Secondary | ICD-10-CM

## 2021-01-21 MED ORDER — CITALOPRAM HYDROBROMIDE 20 MG PO TABS: 20.0000 mg | ORAL_TABLET | Freq: Every day | ORAL | 0 refills | Status: DC

## 2021-01-21 NOTE — Telephone Encounter (Signed)
Pt was not having any symptoms FYI pt just does not want to stop this medication cold [place].   ----- Message from Benson sent at 01/21/2021  2:26 PM EST -----  Pt stated he has been taking two tablets of citalopram (CELEXA) 20 MG tablet and has run out of his medication early, pt wanted to speak with a nurse to see what he can do since he will run out and the pharmacy wont fill it early.     Pt reports refills of Celexa are usually 10 mg tabs, "So I take 2 for the 20mg  ordered." States picked up refill "About a month and 1/2 ago and just noted the tabs were 20mg  tabs "And I've been taking 2 of them, thinking they were the usual 10mg ."  So pt has been taking 40 mg Celexa for 1 1/2 months and is out. States called pharmacy and will not fill early, needs new script. States took last dose 4 days ago, "And starting to feel bad , no energy, little anxious." Requesting med be called in. Please advise:  CB# [phone] Reason for Disposition  [1] DOUBLE DOSE (an extra dose or lesser amount) of prescription drug AND [2] NO symptoms (Exception: a double dose of antibiotics)  Answer Assessment - Initial Assessment Questions 1. NAME of MEDICATION: "What medicine are you calling about?"     celexa 2. QUESTION: "What is your question?" (e.g., double dose of medicine, side effect)     Please see summary. Pt has been mistakenly taking 40mg  and is out 3. PRESCRIBING HCP: "Who prescribed it?" Reason: if prescribed by specialist, call should be referred to that group.     *No Answer* 4. SYMPTOMS: "Do you have any symptoms?"     *No Answer* 5. SEVERITY: If symptoms are present, ask "Are they mild, moderate or severe?"     *No Answer* 6. PREGNANCY:  "Is there any chance that you are pregnant?" "When was your last menstrual period?"     *No Answer*  Protocols used: Medication Question Call-A-AH

## 2021-01-21 NOTE — Telephone Encounter (Signed)
Please advise 

## 2021-04-16 ENCOUNTER — Other Ambulatory Visit: Payer: Self-pay | Admitting: Family Medicine

## 2021-04-16 DIAGNOSIS — F4322 Adjustment disorder with anxiety: Secondary | ICD-10-CM

## 2021-04-16 NOTE — Telephone Encounter (Signed)
Requested medication (s) are due for refill today: no valid encounter ? ?Requested medication (s) are on the active medication list: yes  ? ?Last refill:  01/21/21 #90 0 refills ? ?Future visit scheduled: yes in 6 months ? ?Notes to clinic:  patient can have courtesy refill. No valid encounter in 6 months future visit in 6 months. Do you want to give refills until next OV? ? ? ?  ?Requested Prescriptions  ?Pending Prescriptions Disp Refills  ? citalopram (CELEXA) 20 MG tablet [Pharmacy Med Name: CITALOPRAM 20MG  TABLETS] 90 tablet 0  ?  Sig: TAKE 1 TABLET(20 MG) BY MOUTH DAILY  ?  ? Psychiatry:  Antidepressants - SSRI Failed - 04/16/2021 10:33 AM  ?  ?  Failed - Valid encounter within last 6 months  ?  Recent Outpatient Visits   ? ?      ? 6 months ago Annual physical exam  ? [facility] [name]., MD  ? 1 year ago Annual physical exam  ? [facility] [name]., MD  ? 3 years ago Annual physical exam  ? [facility] [name]., MD  ? 4 years ago Adjustment disorder with anxiety  ? [facility] [name]., MD  ? 4 years ago Viral upper respiratory tract infection  ? [place], [place]  ? ?  ?  ?Future Appointments   ? ?        ? In 6 months [name]., MD [facility]  ? ?  ? ?  ?  ?  Passed - Completed PHQ-2 or PHQ-9 in the last 360 days  ?  ?  ? ?

## 2021-04-17 ENCOUNTER — Telehealth: Payer: Self-pay | Admitting: Family Medicine

## 2021-04-17 ENCOUNTER — Other Ambulatory Visit: Payer: Self-pay

## 2021-04-17 MED ORDER — OMEPRAZOLE 20 MG PO CPDR: 20.0000 mg | DELAYED_RELEASE_CAPSULE | Freq: Every day | ORAL | 3 refills | Status: AC

## 2021-04-17 NOTE — Telephone Encounter (Signed)
[facility] faxed refill request for the following medications: ? ?omeprazole (PRILOSEC) 20 MG capsule  ? ?Please advise. ? ?

## 2021-07-21 DIAGNOSIS — M47896 Other spondylosis, lumbar region: Secondary | ICD-10-CM | POA: Diagnosis not present

## 2021-07-21 DIAGNOSIS — M47816 Spondylosis without myelopathy or radiculopathy, lumbar region: Secondary | ICD-10-CM | POA: Diagnosis not present

## 2021-07-21 DIAGNOSIS — M4316 Spondylolisthesis, lumbar region: Secondary | ICD-10-CM | POA: Insufficient documentation

## 2021-07-21 DIAGNOSIS — M533 Sacrococcygeal disorders, not elsewhere classified: Secondary | ICD-10-CM | POA: Insufficient documentation

## 2021-07-21 DIAGNOSIS — S39012A Strain of muscle, fascia and tendon of lower back, initial encounter: Secondary | ICD-10-CM | POA: Diagnosis not present

## 2021-07-22 ENCOUNTER — Other Ambulatory Visit (INDEPENDENT_AMBULATORY_CARE_PROVIDER_SITE_OTHER): Payer: No Typology Code available for payment source | Admitting: Family Medicine

## 2021-07-22 DIAGNOSIS — M5416 Radiculopathy, lumbar region: Secondary | ICD-10-CM

## 2021-07-22 MED ORDER — HYDROCODONE-ACETAMINOPHEN 10-325 MG PO TABS: 1.0000 | ORAL_TABLET | ORAL | 0 refills | Status: AC | PRN

## 2021-07-22 NOTE — Progress Notes (Signed)
Severe LBP. Unable to get out of bed. On prednisone and muscle relaxant.Drew Lopez 10/325 sentin

## 2021-07-23 ENCOUNTER — Other Ambulatory Visit: Payer: Self-pay | Admitting: Family Medicine

## 2021-07-23 DIAGNOSIS — M5441 Lumbago with sciatica, right side: Secondary | ICD-10-CM

## 2021-07-25 DIAGNOSIS — M5416 Radiculopathy, lumbar region: Secondary | ICD-10-CM | POA: Diagnosis not present

## 2021-07-25 DIAGNOSIS — M5136 Other intervertebral disc degeneration, lumbar region: Secondary | ICD-10-CM | POA: Diagnosis not present

## 2021-07-25 DIAGNOSIS — R531 Weakness: Secondary | ICD-10-CM | POA: Diagnosis not present

## 2021-07-29 ENCOUNTER — Other Ambulatory Visit: Payer: Self-pay | Admitting: Neurosurgery

## 2021-07-29 DIAGNOSIS — R531 Weakness: Secondary | ICD-10-CM

## 2021-07-29 DIAGNOSIS — M5416 Radiculopathy, lumbar region: Secondary | ICD-10-CM

## 2021-07-29 DIAGNOSIS — M5136 Other intervertebral disc degeneration, lumbar region: Secondary | ICD-10-CM

## 2021-08-01 ENCOUNTER — Other Ambulatory Visit: Payer: Self-pay | Admitting: Family Medicine

## 2021-08-01 MED ORDER — HYDROCODONE-ACETAMINOPHEN 10-325 MG PO TABS: 1.0000 | ORAL_TABLET | ORAL | 0 refills | Status: DC | PRN

## 2021-08-02 ENCOUNTER — Ambulatory Visit
Admission: RE | Admit: 2021-08-02 | Discharge: 2021-08-02 | Disposition: A | Discharge status: Home / Self Care | Payer: BC Managed Care – PPO | Source: Ambulatory Visit | Attending: Neurosurgery | Admitting: Neurosurgery

## 2021-08-02 DIAGNOSIS — M48061 Spinal stenosis, lumbar region without neurogenic claudication: Secondary | ICD-10-CM | POA: Diagnosis not present

## 2021-08-02 DIAGNOSIS — M545 Low back pain, unspecified: Secondary | ICD-10-CM | POA: Diagnosis not present

## 2021-08-02 DIAGNOSIS — M5416 Radiculopathy, lumbar region: Secondary | ICD-10-CM

## 2021-08-02 DIAGNOSIS — M51369 Other intervertebral disc degeneration, lumbar region without mention of lumbar back pain or lower extremity pain: Secondary | ICD-10-CM

## 2021-08-02 DIAGNOSIS — R531 Weakness: Secondary | ICD-10-CM

## 2021-08-02 DIAGNOSIS — M5136 Other intervertebral disc degeneration, lumbar region: Secondary | ICD-10-CM

## 2021-08-04 ENCOUNTER — Other Ambulatory Visit: Payer: 59

## 2021-08-06 ENCOUNTER — Other Ambulatory Visit: Payer: Self-pay | Admitting: Neurosurgery

## 2021-08-06 ENCOUNTER — Encounter
Admission: RE | Admit: 2021-08-06 | Discharge: 2021-08-06 | Disposition: A | Discharge status: Home / Self Care | Payer: BC Managed Care – PPO | Source: Ambulatory Visit | Attending: Neurosurgery | Admitting: Neurosurgery

## 2021-08-06 DIAGNOSIS — E785 Hyperlipidemia, unspecified: Secondary | ICD-10-CM | POA: Insufficient documentation

## 2021-08-06 DIAGNOSIS — Z01818 Encounter for other preprocedural examination: Secondary | ICD-10-CM

## 2021-08-06 DIAGNOSIS — R739 Hyperglycemia, unspecified: Secondary | ICD-10-CM | POA: Insufficient documentation

## 2021-08-06 DIAGNOSIS — Z01812 Encounter for preprocedural laboratory examination: Secondary | ICD-10-CM

## 2021-08-06 DIAGNOSIS — M5020 Other cervical disc displacement, unspecified cervical region: Secondary | ICD-10-CM

## 2021-08-06 DIAGNOSIS — M5416 Radiculopathy, lumbar region: Secondary | ICD-10-CM | POA: Insufficient documentation

## 2021-08-06 DIAGNOSIS — Z01810 Encounter for preprocedural cardiovascular examination: Secondary | ICD-10-CM

## 2021-08-06 DIAGNOSIS — R29898 Other symptoms and signs involving the musculoskeletal system: Secondary | ICD-10-CM | POA: Diagnosis not present

## 2021-08-06 LAB — CBC
HCT: 46.3 % (ref 39.0–52.0)
Hemoglobin: 15 g/dL (ref 13.0–17.0)
MCH: 28.3 pg (ref 26.0–34.0)
MCHC: 32.4 g/dL (ref 30.0–36.0)
MCV: 87.4 fL (ref 80.0–100.0)
Platelets: 300 10*3/uL (ref 150–400)
RBC: 5.3 MIL/uL (ref 4.22–5.81)
RDW: 12.3 % (ref 11.5–15.5)
WBC: 8.4 10*3/uL (ref 4.0–10.5)
nRBC: 0 % (ref 0.0–0.2)

## 2021-08-06 LAB — BASIC METABOLIC PANEL
Anion gap: 6 (ref 5–15)
BUN: 21 mg/dL — ABNORMAL HIGH (ref 6–20)
CO2: 27 mmol/L (ref 22–32)
Calcium: 9.6 mg/dL (ref 8.9–10.3)
Chloride: 108 mmol/L (ref 98–111)
Creatinine, Ser: 0.8 mg/dL (ref 0.61–1.24)
GFR, Estimated: >60 mL/min (ref >60)
Glucose, Bld: 102 mg/dL — ABNORMAL HIGH (ref 70–99)
Potassium: 4 mmol/L (ref 3.5–5.1)
Sodium: 141 mmol/L (ref 135–145)

## 2021-08-06 LAB — TYPE AND SCREEN
ABO/RH(D): O POS
Antibody Screen: NEGATIVE

## 2021-08-06 LAB — URINALYSIS, COMPLETE (UACMP) WITH MICROSCOPIC
Bacteria, UA: NONE SEEN
Bilirubin Urine: NEGATIVE
Glucose, UA: NEGATIVE mg/dL
Hgb urine dipstick: NEGATIVE
Ketones, ur: NEGATIVE mg/dL
Leukocytes,Ua: NEGATIVE
Nitrite: NEGATIVE
Protein, ur: NEGATIVE mg/dL
Specific Gravity, Urine: 1.026 (ref 1.005–1.030)
pH: 5 (ref 5.0–8.0)

## 2021-08-06 LAB — SURGICAL PCR SCREEN
MRSA, PCR: NEGATIVE
Staphylococcus aureus: POSITIVE — AB

## 2021-08-08 ENCOUNTER — Encounter
Admission: RE | Admit: 2021-08-08 | Discharge: 2021-08-08 | Disposition: A | Discharge status: Home / Self Care | Payer: BC Managed Care – PPO | Source: Ambulatory Visit | Attending: Neurosurgery | Admitting: Neurosurgery

## 2021-08-08 ENCOUNTER — Other Ambulatory Visit: Payer: Self-pay

## 2021-08-12 ENCOUNTER — Ambulatory Visit
Admission: RE | Admit: 2021-08-12 | Discharge: 2021-08-12 | Disposition: A | Discharge status: Home / Self Care | Payer: BC Managed Care – PPO | Source: Ambulatory Visit | Attending: Neurosurgery | Admitting: Neurosurgery

## 2021-08-12 ENCOUNTER — Ambulatory Visit: Payer: BC Managed Care – PPO | Admitting: Urgent Care

## 2021-08-12 ENCOUNTER — Other Ambulatory Visit: Payer: Self-pay

## 2021-08-12 ENCOUNTER — Encounter
Admission: RE | Disposition: A | Discharge status: Home / Self Care | Payer: Self-pay | Source: Ambulatory Visit | Attending: Neurosurgery

## 2021-08-12 ENCOUNTER — Ambulatory Visit: Payer: BC Managed Care – PPO

## 2021-08-12 ENCOUNTER — Encounter: Payer: Self-pay | Admitting: Neurosurgery

## 2021-08-12 DIAGNOSIS — K219 Gastro-esophageal reflux disease without esophagitis: Secondary | ICD-10-CM | POA: Diagnosis not present

## 2021-08-12 DIAGNOSIS — F419 Anxiety disorder, unspecified: Secondary | ICD-10-CM | POA: Diagnosis not present

## 2021-08-12 DIAGNOSIS — G4733 Obstructive sleep apnea (adult) (pediatric): Secondary | ICD-10-CM | POA: Diagnosis not present

## 2021-08-12 DIAGNOSIS — R739 Hyperglycemia, unspecified: Secondary | ICD-10-CM

## 2021-08-12 DIAGNOSIS — G4730 Sleep apnea, unspecified: Secondary | ICD-10-CM | POA: Insufficient documentation

## 2021-08-12 DIAGNOSIS — R29898 Other symptoms and signs involving the musculoskeletal system: Secondary | ICD-10-CM | POA: Insufficient documentation

## 2021-08-12 DIAGNOSIS — Z79899 Other long term (current) drug therapy: Secondary | ICD-10-CM | POA: Insufficient documentation

## 2021-08-12 DIAGNOSIS — F32A Depression, unspecified: Secondary | ICD-10-CM | POA: Diagnosis not present

## 2021-08-12 DIAGNOSIS — J45909 Unspecified asthma, uncomplicated: Secondary | ICD-10-CM | POA: Diagnosis not present

## 2021-08-12 DIAGNOSIS — Z01810 Encounter for preprocedural cardiovascular examination: Secondary | ICD-10-CM

## 2021-08-12 DIAGNOSIS — M1990 Unspecified osteoarthritis, unspecified site: Secondary | ICD-10-CM | POA: Diagnosis not present

## 2021-08-12 DIAGNOSIS — Z01812 Encounter for preprocedural laboratory examination: Secondary | ICD-10-CM

## 2021-08-12 DIAGNOSIS — E785 Hyperlipidemia, unspecified: Secondary | ICD-10-CM

## 2021-08-12 DIAGNOSIS — M5020 Other cervical disc displacement, unspecified cervical region: Secondary | ICD-10-CM

## 2021-08-12 DIAGNOSIS — M5416 Radiculopathy, lumbar region: Secondary | ICD-10-CM | POA: Diagnosis not present

## 2021-08-12 DIAGNOSIS — Z0389 Encounter for observation for other suspected diseases and conditions ruled out: Secondary | ICD-10-CM | POA: Diagnosis not present

## 2021-08-12 HISTORY — PX: LUMBAR LAMINECTOMY/DECOMPRESSION MICRODISCECTOMY: SHX5026

## 2021-08-12 SURGERY — LUMBAR LAMINECTOMY/DECOMPRESSION MICRODISCECTOMY 1 LEVEL
Anesthesia: General | Laterality: Right

## 2021-08-12 MED ORDER — BUPIVACAINE HCL (PF) 0.5 % IJ SOLN
INTRAMUSCULAR | Status: AC
Start: 2021-08-12 — End: ?
  Filled 2021-08-12: qty 30

## 2021-08-12 MED ORDER — DEXAMETHASONE SODIUM PHOSPHATE 10 MG/ML IJ SOLN
INTRAMUSCULAR | Status: DC | PRN
  Administered 2021-08-12: 8 mg via INTRAVENOUS

## 2021-08-12 MED ORDER — 0.9 % SODIUM CHLORIDE (POUR BTL) OPTIME
TOPICAL | Status: DC | PRN
  Administered 2021-08-12: 500 mL

## 2021-08-12 MED ORDER — LIDOCAINE HCL (CARDIAC) PF 100 MG/5ML IV SOSY
PREFILLED_SYRINGE | INTRAVENOUS | Status: DC | PRN
  Administered 2021-08-12: 80 mg via INTRAVENOUS

## 2021-08-12 MED ORDER — PHENYLEPHRINE 80 MCG/ML (10ML) SYRINGE FOR IV PUSH (FOR BLOOD PRESSURE SUPPORT)
PREFILLED_SYRINGE | INTRAVENOUS | Status: DC | PRN
  Administered 2021-08-12: 80 ug via INTRAVENOUS

## 2021-08-12 MED ORDER — GABAPENTIN 300 MG PO CAPS
ORAL_CAPSULE | ORAL | Status: AC
  Administered 2021-08-12: 300 mg
  Filled 2021-08-12: qty 1

## 2021-08-12 MED ORDER — SODIUM CHLORIDE FLUSH 0.9 % IV SOLN
INTRAVENOUS | Status: AC
Start: 2021-08-12 — End: ?
  Filled 2021-08-12: qty 20

## 2021-08-12 MED ORDER — FENTANYL CITRATE (PF) 100 MCG/2ML IJ SOLN
INTRAMUSCULAR | Status: DC | PRN
  Administered 2021-08-12 (×2): 50 ug via INTRAVENOUS

## 2021-08-12 MED ORDER — METHYLPREDNISOLONE ACETATE 40 MG/ML IJ SUSP
INTRAMUSCULAR | Status: DC | PRN
  Administered 2021-08-12: 40 mg

## 2021-08-12 MED ORDER — CEFAZOLIN SODIUM-DEXTROSE 2-4 GM/100ML-% IV SOLN
2.0000 g | INTRAVENOUS | Status: AC
  Administered 2021-08-12: 2 g via INTRAVENOUS

## 2021-08-12 MED ORDER — MIDAZOLAM HCL 2 MG/2ML IJ SOLN
INTRAMUSCULAR | Status: AC
  Filled 2021-08-12: qty 2

## 2021-08-12 MED ORDER — METHYLPREDNISOLONE ACETATE 40 MG/ML IJ SUSP
INTRAMUSCULAR | Status: AC
  Filled 2021-08-12: qty 1

## 2021-08-12 MED ORDER — FENTANYL CITRATE (PF) 100 MCG/2ML IJ SOLN
INTRAMUSCULAR | Status: AC
  Filled 2021-08-12: qty 2

## 2021-08-12 MED ORDER — PROPOFOL 10 MG/ML IV BOLUS
INTRAVENOUS | Status: DC | PRN
  Administered 2021-08-12: 50 mg via INTRAVENOUS
  Administered 2021-08-12: 200 mg via INTRAVENOUS
  Administered 2021-08-12: 40 mg via INTRAVENOUS

## 2021-08-12 MED ORDER — OXYCODONE-ACETAMINOPHEN 5-325 MG PO TABS: 1.0000 | ORAL_TABLET | ORAL | 0 refills | Status: AC | PRN

## 2021-08-12 MED ORDER — PHENYLEPHRINE HCL-NACL 20-0.9 MG/250ML-% IV SOLN
INTRAVENOUS | Status: DC | PRN
  Administered 2021-08-12: 20 ug/min via INTRAVENOUS

## 2021-08-12 MED ORDER — CEFAZOLIN SODIUM-DEXTROSE 2-4 GM/100ML-% IV SOLN
INTRAVENOUS | Status: AC
  Filled 2021-08-12: qty 100

## 2021-08-12 MED ORDER — REMIFENTANIL HCL 1 MG IV SOLR
INTRAVENOUS | Status: DC | PRN
  Administered 2021-08-12: .1 ug/kg/min via INTRAVENOUS

## 2021-08-12 MED ORDER — ORAL CARE MOUTH RINSE: 15.0000 mL | Freq: Once | OROMUCOSAL | Status: AC

## 2021-08-12 MED ORDER — REMIFENTANIL HCL 1 MG IV SOLR
INTRAVENOUS | Status: AC
  Filled 2021-08-12: qty 1000

## 2021-08-12 MED ORDER — CHLORHEXIDINE GLUCONATE 0.12 % MT SOLN: 15.0000 mL | Freq: Once | OROMUCOSAL | Status: AC

## 2021-08-12 MED ORDER — ACETAMINOPHEN 500 MG PO TABS: 1000.0000 mg | ORAL_TABLET | Freq: Once | ORAL | Status: AC

## 2021-08-12 MED ORDER — STERILE WATER FOR IRRIGATION IR SOLN
Status: DC | PRN
  Administered 2021-08-12: 1000 mL

## 2021-08-12 MED ORDER — LACTATED RINGERS IV SOLN: INTRAVENOUS | Status: DC | PRN

## 2021-08-12 MED ORDER — BUPIVACAINE-EPINEPHRINE (PF) 0.5% -1:200000 IJ SOLN
INTRAMUSCULAR | Status: DC | PRN
  Administered 2021-08-12: 3 mL

## 2021-08-12 MED ORDER — PROPOFOL 1000 MG/100ML IV EMUL
INTRAVENOUS | Status: AC
  Filled 2021-08-12: qty 100

## 2021-08-12 MED ORDER — GABAPENTIN 600 MG PO TABS
300.0000 mg | ORAL_TABLET | Freq: Once | ORAL | Status: DC
  Filled 2021-08-12: qty 0.5

## 2021-08-12 MED ORDER — CYCLOBENZAPRINE HCL 10 MG PO TABS: 10.0000 mg | ORAL_TABLET | Freq: Three times a day (TID) | ORAL | 0 refills | Status: DC | PRN

## 2021-08-12 MED ORDER — BUPIVACAINE-EPINEPHRINE (PF) 0.5% -1:200000 IJ SOLN
INTRAMUSCULAR | Status: AC
  Filled 2021-08-12: qty 30

## 2021-08-12 MED ORDER — SURGIFLO WITH THROMBIN (HEMOSTATIC MATRIX KIT) OPTIME
TOPICAL | Status: DC | PRN
  Administered 2021-08-12: 1 via TOPICAL

## 2021-08-12 MED ORDER — SUCCINYLCHOLINE CHLORIDE 200 MG/10ML IV SOSY
PREFILLED_SYRINGE | INTRAVENOUS | Status: DC | PRN
  Administered 2021-08-12: 140 mg via INTRAVENOUS

## 2021-08-12 MED ORDER — PROPOFOL 10 MG/ML IV BOLUS
INTRAVENOUS | Status: AC
Start: 2021-08-12 — End: ?
  Filled 2021-08-12: qty 40

## 2021-08-12 MED ORDER — ACETAMINOPHEN 500 MG PO TABS
ORAL_TABLET | ORAL | Status: AC
  Administered 2021-08-12: 1000 mg via ORAL
  Filled 2021-08-12: qty 1

## 2021-08-12 MED ORDER — SODIUM CHLORIDE (PF) 0.9 % IJ SOLN
INTRAMUSCULAR | Status: DC | PRN
  Administered 2021-08-12: 70 mL via SURGICAL_CAVITY

## 2021-08-12 MED ORDER — ONDANSETRON HCL 4 MG/2ML IJ SOLN
INTRAMUSCULAR | Status: DC | PRN
  Administered 2021-08-12: 4 mg via INTRAVENOUS

## 2021-08-12 MED ORDER — FENTANYL CITRATE (PF) 100 MCG/2ML IJ SOLN: 25.0000 ug | INTRAMUSCULAR | Status: DC | PRN

## 2021-08-12 MED ORDER — BUPIVACAINE LIPOSOME 1.3 % IJ SUSP
INTRAMUSCULAR | Status: AC
Start: 2021-08-12 — End: ?
  Filled 2021-08-12: qty 20

## 2021-08-12 MED ORDER — PROPOFOL 500 MG/50ML IV EMUL
INTRAVENOUS | Status: DC | PRN
  Administered 2021-08-12: 125 ug/kg/min via INTRAVENOUS

## 2021-08-12 MED ORDER — CHLORHEXIDINE GLUCONATE 0.12 % MT SOLN
OROMUCOSAL | Status: AC
  Administered 2021-08-12: 15 mL via OROMUCOSAL
  Filled 2021-08-12: qty 15

## 2021-08-12 MED ORDER — MIDAZOLAM HCL 2 MG/2ML IJ SOLN
INTRAMUSCULAR | Status: DC | PRN
  Administered 2021-08-12 (×2): 1 mg via INTRAVENOUS

## 2021-08-12 MED ORDER — SENNA 8.6 MG PO TABS: 1.0000 | ORAL_TABLET | Freq: Every day | ORAL | 0 refills | Status: DC | PRN

## 2021-08-12 SURGICAL SUPPLY — 61 items
ADH SKN CLS APL DERMABOND .7 (GAUZE/BANDAGES/DRESSINGS) ×1
AGENT HMST KT MTR STRL THRMB (HEMOSTASIS) ×1
APL PRP STRL LF DISP 70% ISPRP (MISCELLANEOUS) ×2
BASIN KIT SINGLE STR (MISCELLANEOUS) ×2 IMPLANT
BUR NEURO DRILL SOFT 3.0X3.8M (BURR) ×2 IMPLANT
CHLORAPREP W/TINT 26 (MISCELLANEOUS) ×4 IMPLANT
CNTNR SPEC 2.5X3XGRAD LEK (MISCELLANEOUS) ×1
CONT SPEC 4OZ STER OR WHT (MISCELLANEOUS) ×1
CONT SPEC 4OZ STRL OR WHT (MISCELLANEOUS) ×1
CONTAINER SPEC 2.5X3XGRAD LEK (MISCELLANEOUS) ×1 IMPLANT
COUNTER NEEDLE 20/40 LG (NEEDLE) ×2 IMPLANT
CUP MEDICINE 2OZ PLAST GRAD ST (MISCELLANEOUS) ×4 IMPLANT
DERMABOND ADVANCED (GAUZE/BANDAGES/DRESSINGS) ×1
DERMABOND ADVANCED .7 DNX12 (GAUZE/BANDAGES/DRESSINGS) ×1 IMPLANT
DRAPE C ARM PK CFD 31 SPINE (DRAPES) ×2 IMPLANT
DRAPE LAPAROTOMY 100X77 ABD (DRAPES) ×2 IMPLANT
DRAPE MICROSCOPE SPINE 48X150 (DRAPES) ×2 IMPLANT
DRAPE SURG 17X11 SM STRL (DRAPES) ×2 IMPLANT
DRSG OPSITE POSTOP 3X4 (GAUZE/BANDAGES/DRESSINGS) ×1 IMPLANT
ELECT CAUTERY BLADE TIP 2.5 (TIP) ×2
ELECT EZSTD 165MM 6.5IN (MISCELLANEOUS)
ELECT REM PT RETURN 9FT ADLT (ELECTROSURGICAL) ×2
ELECTRODE CAUTERY BLDE TIP 2.5 (TIP) ×1 IMPLANT
ELECTRODE EZSTD 165MM 6.5IN (MISCELLANEOUS) IMPLANT
ELECTRODE REM PT RTRN 9FT ADLT (ELECTROSURGICAL) ×1 IMPLANT
GLOVE BIOGEL PI IND STRL 6.5 (GLOVE) ×1 IMPLANT
GLOVE BIOGEL PI IND STRL 8.5 (GLOVE) ×1 IMPLANT
GLOVE BIOGEL PI INDICATOR 6.5 (GLOVE) ×1
GLOVE BIOGEL PI INDICATOR 8.5 (GLOVE) ×1
GLOVE SURG SYN 6.5 ES PF (GLOVE) ×4 IMPLANT
GLOVE SURG SYN 6.5 PF PI (GLOVE) ×2 IMPLANT
GLOVE SURG SYN 8.5  E (GLOVE) ×3
GLOVE SURG SYN 8.5 E (GLOVE) ×3 IMPLANT
GLOVE SURG SYN 8.5 PF PI (GLOVE) ×3 IMPLANT
GOWN SRG LRG LVL 4 IMPRV REINF (GOWNS) ×1 IMPLANT
GOWN SRG XL LVL 3 NONREINFORCE (GOWNS) ×1 IMPLANT
GOWN STRL NON-REIN TWL XL LVL3 (GOWNS) ×2
GOWN STRL REIN LRG LVL4 (GOWNS) ×2
GRADUATE 1200CC STRL 31836 (MISCELLANEOUS) ×2 IMPLANT
KIT SPINAL PRONEVIEW (KITS) ×2 IMPLANT
MANIFOLD NEPTUNE II (INSTRUMENTS) ×2 IMPLANT
MARKER SKIN DUAL TIP RULER LAB (MISCELLANEOUS) ×4 IMPLANT
NDL SAFETY ECLIPSE 18X1.5 (NEEDLE) ×1 IMPLANT
NEEDLE HYPO 18GX1.5 SHARP (NEEDLE) ×2
NEEDLE HYPO 22GX1.5 SAFETY (NEEDLE) ×2 IMPLANT
NS IRRIG 1000ML POUR BTL (IV SOLUTION) ×2 IMPLANT
PACK LAMINECTOMY NEURO (CUSTOM PROCEDURE TRAY) ×2 IMPLANT
PAD ARMBOARD 7.5X6 YLW CONV (MISCELLANEOUS) ×2 IMPLANT
SOLUTION IRRIG SURGIPHOR (IV SOLUTION) ×2 IMPLANT
SURGIFLO W/THROMBIN 8M KIT (HEMOSTASIS) ×2 IMPLANT
SUT DVC VLOC 3-0 CL 6 P-12 (SUTURE) ×2 IMPLANT
SUT VIC AB 0 CT1 27 (SUTURE) ×2
SUT VIC AB 0 CT1 27XCR 8 STRN (SUTURE) ×1 IMPLANT
SUT VIC AB 2-0 CT1 18 (SUTURE) ×2 IMPLANT
SYR 10ML LL (SYRINGE) ×2 IMPLANT
SYR 20ML LL LF (SYRINGE) ×2 IMPLANT
SYR 30ML LL (SYRINGE) ×4 IMPLANT
SYR 3ML LL SCALE MARK (SYRINGE) ×2 IMPLANT
TOWEL OR 17X26 4PK STRL BLUE (TOWEL DISPOSABLE) ×6 IMPLANT
TUBING CONNECTING 10 (TUBING) ×2 IMPLANT
WATER STERILE IRR 1000ML POUR (IV SOLUTION) ×4 IMPLANT

## 2021-08-12 NOTE — Op Note (Signed)
Indications: Drew Lopez is a 61 yo male who presented with: Lumbar radiculopathy M54.16, Right leg weakness R29.898  He had worsening symptoms prompting surgical intervention.  Findings: disc herniation  Preoperative Diagnosis: Lumbar radiculopathy M54.16, Right leg weakness R29.898 Postoperative Diagnosis: same   EBL: 400 ml IVF: see AR ml Drains: none Disposition: Extubated and Stable to PACU Complications: none  No foley catheter was placed.   Preoperative Note:   Risks of surgery discussed include: infection, bleeding, stroke, coma, death, paralysis, CSF leak, nerve/spinal cord injury, numbness, tingling, weakness, complex regional pain syndrome, recurrent stenosis and/or disc herniation, vascular injury, development of instability, neck/back pain, need for further surgery, persistent symptoms, development of deformity, and the risks of anesthesia. The patient understood these risks and agreed to proceed.  Operative Note:   1) Right L1/2 microdiscectomy  The patient was then brought from the preoperative center with intravenous access established.  The patient underwent general anesthesia and endotracheal tube intubation, and was then rotated on the Beggs rail top where all pressure points were appropriately padded.  The skin was then thoroughly cleansed.  Perioperative antibiotic prophylaxis was administered.  Sterile prep and drapes were then applied and a timeout was then observed.  C-arm was brought into the field under sterile conditions, and the L1-2 disc space identified and marked with an incision on the right 1cm lateral to midline.  Once this was complete a 2 cm incision was opened with the use of a #10 blade knife.  The Metrx tubes were sequentially advanced under lateral fluoroscopy until a 18 x 50 mm Metrx tube was placed over the facet and lamina and secured to the bed.    The microscope was then sterilely brought into the field and muscle creep was hemostased with  a bipolar and resected with a pituitary rongeur.  A Bovie extender was then used to expose the spinous process and lamina.  Careful attention was placed to not violate the facet capsule. A 3 mm matchstick drill bit was then used to make a hemi-laminotomy trough until the ligamentum flavum was exposed.  This was extended to the base of the spinous process.  Once this was complete and the underlying ligamentum flavum was visualized, the ligamentum was dissected with an up angle curette and resected with a #2 and #3 mm biting Kerrison.  The laminotomy opening was also expanded in similar fashion and hemostasis was obtained with Surgifoam and a patty as well as bone wax.  The rostral aspect of the caudal level of the lamina was also resected with a #2 biting Kerrison effort to further enhance exposure.  Once the underlying dura was visualized a Penfield 4 was then used to dissect and expose the traversing nerve root.  Once this was identified a nerve root retractor suction was used to mobilize this medially.  The venous plexus was hemostased with Surgifoam and light bipolar use.  A small penfied was then used to make a small annulotomy within the disc space and disc space contents were noted to come through the annulus.    The disc herniation was identified and dissected free using a balltip probe. The pituitary rongeur was used to remove the extruded disc fragments. Once the thecal sac and nerve root were noted to be relaxed and under less tension the ball-tipped feeler was passed along the foramen distally to ensure no residual compression was noted.    Depo-Medrol was placed along the nerve root.  The area was irrigated. The tube system was  then removed under microscopic visualization and hemostasis was obtained with a bipolar.    The fascial layer was reapproximated with the use of a 0- Vicryl suture.  Subcutaneous tissue layer was reapproximated using 2-0 Vicryl suture.  3-0 monocryl was used on the skin. The  skin was then cleansed and Dermabond was used to close the skin opening.  Patient was then rotated back to the preoperative bed awakened from anesthesia and taken to recovery all counts are correct in this case.   I performed the entire procedure with the assistance of Manning Charity PA as an Designer, television/film set.  Venetia Night MD

## 2021-08-12 NOTE — Discharge Summary (Signed)
Physician Discharge Summary  Patient ID: Drew Lopez MRN: [id number] DOB/AGE: 04-01-1960 60 y.o.  Admit date: 08/12/2021 Discharge date: 08/12/2021  Admission Diagnoses: Lumbar radiculopathy  Discharge Diagnoses:  Active Problems:   * No active hospital problems. *   Discharged Condition: good  Hospital Course:  Drew Lopez is a 61 y.o s/p right L1-2 microdiscectomy. His intraoperative course was uncomplicated.   Consults:  none   Significant Diagnostic Studies: none   Treatments: surgery: as above. Please see separately dictated operative report for further details.   Discharge Exam: Blood pressure (!) 139/95, pulse 83, temperature 98.5 F (36.9 C), temperature source Oral, resp. rate 17, height 6' (1.829 m), weight 104.8 kg, SpO2 94 %. CN II-XII grossly intact  5/5 throughout BLE  Incision c/d/i  Disposition:    Allergies as of 08/12/2021       Reactions   Promethazine Other (See Comments)   Makes pt VERY JITTERY        Medication List     STOP taking these medications    HYDROcodone-acetaminophen 10-325 MG tablet Commonly known as: Norco       TAKE these medications    celecoxib 100 MG capsule Commonly known as: CELEBREX Take 100 mg by mouth 2 (two) times daily.   citalopram 20 MG tablet Commonly known as: CELEXA TAKE 1 TABLET(20 MG) BY MOUTH DAILY   cyclobenzaprine 10 MG tablet Commonly known as: FLEXERIL Take 1 tablet (10 mg total) by mouth 3 (three) times daily as needed for muscle spasms. What changed:  medication strength how much to take reasons to take this   docusate sodium 100 MG capsule Commonly known as: COLACE Take 100 mg by mouth daily as needed for mild constipation.   fexofenadine 180 MG tablet Commonly known as: ALLEGRA Take 180 mg by mouth at bedtime.   finasteride 5 MG tablet Commonly known as: PROSCAR TAKE 1 TABLET(5 MG) BY MOUTH DAILY   Multiple Vitamin tablet Take 1 tablet by mouth daily.   omeprazole 20  MG capsule Commonly known as: PRILOSEC Take 1 capsule (20 mg total) by mouth daily. TAKE 1 CAPSULE(20 MG) BY MOUTH DAILY   oxyCODONE-acetaminophen 5-325 MG tablet Commonly known as: Percocet Take 1 tablet by mouth every 4 (four) hours as needed for up to 5 days for severe pain.   senna 8.6 MG Tabs tablet Commonly known as: SENOKOT Take 1 tablet (8.6 mg total) by mouth daily as needed for mild constipation.   tadalafil 20 MG tablet Commonly known as: CIALIS Take 0.5-1 tablets (10-20 mg total) by mouth every other day as needed for erectile dysfunction.        Follow-up Information     Susanne Borders, PA Follow up in 2 week(s).   Specialty: Neurosurgery Why: for post-op and incision check Contact information: [address] [place] [address] [phone]                 Signed: Susanne Borders 08/12/2021, 5:11 PM

## 2021-08-12 NOTE — Progress Notes (Signed)
Patient able to ambulate in the unit, void, and able to tolerate crackers and soda.,

## 2021-08-13 ENCOUNTER — Encounter: Payer: Self-pay | Admitting: Neurosurgery

## 2021-08-13 NOTE — Anesthesia Postprocedure Evaluation (Signed)
Anesthesia Post Note  Patient: Drew Lopez  Procedure(s) Performed: RIGHT L1-2 MICRODISCECTOMY (Right)  Patient location during evaluation: PACU Anesthesia Type: General Level of consciousness: awake and alert Pain management: pain level controlled Vital Signs Assessment: post-procedure vital signs reviewed and stable Respiratory status: spontaneous breathing, nonlabored ventilation and respiratory function stable Cardiovascular status: blood pressure returned to baseline and stable Postop Assessment: no apparent nausea or vomiting Anesthetic complications: yes   Encounter Notable Events  Notable Event Outcome Phase Comment  Difficult to intubate - expected  Intraprocedure Filed from anesthesia note documentation.     Last Vitals:  Vitals:   08/12/21 1830 08/12/21 1832  BP: 116/60 116/60  Pulse: 86 86  Resp: (!) 21 18  Temp:  (!) 36.3 C  SpO2: 91% 92%    Last Pain:  Vitals:   08/12/21 1832  TempSrc: Temporal  PainSc: 0-No pain                 Iran Ouch

## 2021-08-22 ENCOUNTER — Encounter: Payer: Self-pay | Admitting: Neurosurgery

## 2021-08-22 ENCOUNTER — Ambulatory Visit (INDEPENDENT_AMBULATORY_CARE_PROVIDER_SITE_OTHER): Payer: BC Managed Care – PPO | Admitting: Neurosurgery

## 2021-08-22 VITALS — BP 136/90 | HR 87 | Temp 98.2°F | Ht 73.0 in

## 2021-08-22 DIAGNOSIS — R29898 Other symptoms and signs involving the musculoskeletal system: Secondary | ICD-10-CM

## 2021-08-22 DIAGNOSIS — Z9889 Other specified postprocedural states: Secondary | ICD-10-CM

## 2021-08-22 DIAGNOSIS — M5416 Radiculopathy, lumbar region: Secondary | ICD-10-CM

## 2021-08-22 DIAGNOSIS — Z09 Encounter for follow-up examination after completed treatment for conditions other than malignant neoplasm: Secondary | ICD-10-CM

## 2021-08-22 NOTE — Progress Notes (Signed)
   REFERRING PHYSICIAN:  [name]., Md [address] [place],  [place] [address]  DOS: 08/12/21 right L1-2 microdisectomy   HISTORY OF PRESENT ILLNESS: Drew Lopez is approximately 2 weeks status post lumbar discectomy. he is doing well postoperatively with complete resolution of his right leg pain.  He does have some mild numbness in his right hip.  He denies any incisional concerns and is not currently taking any medications for pain.  PHYSICAL EXAMINATION:  General: Patient is well developed, well nourished, calm, collected, and in no apparent distress.   NEUROLOGICAL:  General: In no acute distress.   Awake, alert, oriented to person, place, and time.  Pupils equal round and reactive to light.  Facial tone is symmetric.  Tongue protrusion is midline.  There is no pronator drift.   Strength:            Side Iliopsoas Quads Hamstring PF DF EHL  R '5 5 5 5 5 5  '$ L '5 5 5 5 5 5   '$ Incision c/d/I and healing well  ROS (Neurologic):  Negative except as noted above  IMAGING: Interval imaging to review  ASSESSMENT/PLAN:  SALMAN WELLEN is doing well approximately 2 weeks after lumbar discectomy.  We discussed activity escalation and I have advised the patient to lift up to 10 pounds until 6 weeks after surgery, then increase up to 25 pounds until 12 weeks after surgery.  After 12 weeks post-op, the patient advised to increase activity as tolerated. he will follow up with Dr. Izora Ribas in 4 weeks or sooner should he have any questions or concerns.  He expressed understanding and was in agreement with this plan.  Cooper Render PA-C Department of neurosurgery

## 2021-09-25 ENCOUNTER — Encounter: Payer: Self-pay | Admitting: Neurosurgery

## 2021-09-25 ENCOUNTER — Ambulatory Visit: Payer: BC Managed Care – PPO | Admitting: Neurosurgery

## 2021-09-25 VITALS — BP 141/94 | HR 80 | Temp 98.0°F | Ht 73.0 in | Wt 236.2 lb

## 2021-09-25 DIAGNOSIS — M5416 Radiculopathy, lumbar region: Secondary | ICD-10-CM

## 2021-09-25 NOTE — Progress Notes (Signed)
   REFERRING PHYSICIAN:  Meade Maw, [address] [place],  [place] [address]  DOS: 08/12/21 right L1-2 microdisectomy   HISTORY OF PRESENT ILLNESS: Drew Lopez is approximately 6 weeks status post lumbar discectomy. he is doing well postoperatively with complete resolution of his right leg pain.     PHYSICAL EXAMINATION:  General: Patient is well developed, well nourished, calm, collected, and in no apparent distress.   NEUROLOGICAL:  General: In no acute distress.   Awake, alert, oriented to person, place, and time.  Pupils equal round and reactive to light.  Facial tone is symmetric.  Tongue protrusion is midline.  There is no pronator drift.   Strength:            Side Iliopsoas Quads Hamstring PF DF EHL  R '5 5 5 5 5 5  '$ L '5 5 5 5 5 5   '$ Incision c/d/I and healing well  ROS (Neurologic):  Negative except as noted above  IMAGING: Interval imaging to review  ASSESSMENT/PLAN:  Drew Lopez is doing well after lumbar discectomy.  We discussed activity limitations.  He is now on a 25 pound lifting limit.  He will slowly increase activity over time.  I will see him back in 6 weeks or sooner with issues.  If he is doing extremely well, he may cancel that appointment.    Meade Maw MD Department of neurosurgery

## 2021-10-15 ENCOUNTER — Ambulatory Visit (INDEPENDENT_AMBULATORY_CARE_PROVIDER_SITE_OTHER): Payer: BC Managed Care – PPO | Admitting: Family Medicine

## 2021-10-15 ENCOUNTER — Encounter: Payer: Self-pay | Admitting: Family Medicine

## 2021-10-15 VITALS — BP 117/74 | HR 59 | Resp 14 | Ht 72.0 in | Wt 234.0 lb

## 2021-10-15 DIAGNOSIS — Z0000 Encounter for general adult medical examination without abnormal findings: Secondary | ICD-10-CM

## 2021-10-15 DIAGNOSIS — Z125 Encounter for screening for malignant neoplasm of prostate: Secondary | ICD-10-CM | POA: Diagnosis not present

## 2021-10-15 DIAGNOSIS — K219 Gastro-esophageal reflux disease without esophagitis: Secondary | ICD-10-CM

## 2021-10-15 DIAGNOSIS — G4733 Obstructive sleep apnea (adult) (pediatric): Secondary | ICD-10-CM

## 2021-10-15 DIAGNOSIS — Z9889 Other specified postprocedural states: Secondary | ICD-10-CM

## 2021-10-15 DIAGNOSIS — E785 Hyperlipidemia, unspecified: Secondary | ICD-10-CM | POA: Diagnosis not present

## 2021-10-15 DIAGNOSIS — R739 Hyperglycemia, unspecified: Secondary | ICD-10-CM

## 2021-10-15 NOTE — Progress Notes (Signed)
Complete physical exam  I,[name],acting as a scribe for [name], MD.,have documented all relevant documentation on the behalf of [name], MD,as directed by  [name], MD while in the presence of [name], MD.   Patient: Drew Lopez   DOB: 07-Aug-1960   60 y.o. Male  MRN: [id number] Visit Date: 10/15/2021  Today's healthcare provider: [name], MD   Chief Complaint  Patient presents with   Annual Exam   Subjective    Drew Lopez is a 61 y.o. male who presents today for a complete physical exam.  He reports consuming a general diet. Home exercise routine includes walking. He generally feels fairly well. He reports sleeping fairly well. He does not have additional problems to discuss today.  Patient doing well after have an urgent surgery for lumbar radiculitis.  Had microdiscectomy  HPI    Past Medical History:  Diagnosis Date   Allergy    Anxiety    Arthritis    BACK-LOWER   Cancer ([place])    BASAL CELL   Complication of anesthesia    GERD (gastroesophageal reflux disease)    History of kidney stones    h/o   Low back pain    PONV (postoperative nausea and vomiting)    Sleep apnea    USES CPAP   Vertigo    Past Surgical History:  Procedure Laterality Date   ANAL FISTULOTOMY N/A 06/23/2017   Procedure: ANAL FISTULOTOMY;  Surgeon: Robert Bellow, MD;  Location: [facility];  Service: General;  Laterality: N/A;   APPENDECTOMY     COLONOSCOPY  2016   Dr [name]   HERNIA REPAIR Bilateral    10   LUMBAR LAMINECTOMY/DECOMPRESSION MICRODISCECTOMY Right 08/12/2021   Procedure: RIGHT L1-2 MICRODISCECTOMY;  Surgeon: Meade Maw, MD;  Location: [facility];  Service: Neurosurgery;  Laterality: Right;   POSTERIOR CERVICAL LAMINECTOMY N/A 01/28/2018   Procedure: LEFT CERVICAL SEVEN  THORACIC ONE LAMENECTOMY/FORAMINOTOMY;  Surgeon: [name], MD;  Location: [facility];  Service: Neurosurgery;  Laterality: N/A;    SHOULDER ARTHROSCOPY     TONSILLECTOMY     WISDOM TOOTH EXTRACTION     Social History   Socioeconomic History   Marital status: Significant Other    Spouse name: Crespo,Sharon   Number of children: Not on file   Years of education: Not on file   Highest education level: Not on file  Occupational History   Not on file  Tobacco Use   Smoking status: Never   Smokeless tobacco: Never  Vaping Use   Vaping Use: Never used  Substance and Sexual Activity   Alcohol use: No   Drug use: No   Sexual activity: Not on file  Other Topics Concern   Not on file  Social History Narrative   Not on file   Social Determinants of Health   Financial Resource Strain: Not on file  Food Insecurity: Not on file  Transportation Needs: Not on file  Physical Activity: Not on file  Stress: Not on file  Social Connections: Not on file  Intimate Partner Violence: Not on file   Family Status  Relation Name Status   Father  Deceased   Mother  Deceased at age 18       cause of death was old age   Sister  Alive   Brother  Alive   Brother  Alive   Brother  Alive   Family History  Problem  Relation Age of Onset   Alzheimer's disease Father    Allergies  Allergen Reactions   Promethazine Other (See Comments)    Makes pt VERY JITTERY Makes patient very jittery  Makes pt VERY JITTERY    Patient Care Team: [name]., MD as PCP - General (Family Medicine) [name], [name], MD (General Surgery) [name]., MD (Family Medicine)   Medications: Outpatient Medications Prior to Visit  Medication Sig   citalopram (CELEXA) 20 MG tablet TAKE 1 TABLET(20 MG) BY MOUTH DAILY   docusate sodium (COLACE) 100 MG capsule Take 100 mg by mouth daily as needed for mild constipation.   fexofenadine (ALLEGRA) 180 MG tablet Take 180 mg by mouth at bedtime.    finasteride (PROSCAR) 5 MG tablet TAKE 1 TABLET(5 MG) BY MOUTH DAILY   Multiple Vitamin tablet Take 1 tablet by mouth daily.     omeprazole (PRILOSEC) 20 MG capsule Take 1 capsule (20 mg total) by mouth daily. TAKE 1 CAPSULE(20 MG) BY MOUTH DAILY   tadalafil (CIALIS) 20 MG tablet SMARTSIG:0.5-1 Tablet(s) By Mouth Every Other Day PRN   No facility-administered medications prior to visit.    Review of Systems  HENT:  Positive for congestion and sneezing.   Eyes:  Positive for redness and itching.  Musculoskeletal:  Positive for arthralgias, back pain, neck pain and neck stiffness.  All other systems reviewed and are negative.      Objective     BP 117/74 (BP Location: Left Arm, Patient Position: Sitting, Cuff Size: Large)   Pulse (!) 59   Resp 14   Ht 6' (1.829 m)   Wt 234 lb (106.1 kg)   SpO2 96%   BMI 31.74 kg/m  BP Readings from Last 3 Encounters:  10/15/21 117/74  09/25/21 (!) 141/94  08/22/21 136/90   Wt Readings from Last 3 Encounters:  10/15/21 234 lb (106.1 kg)  09/25/21 236 lb 3.2 oz (107.1 kg)  08/12/21 231 lb (104.8 kg)       Physical Exam Vitals and nursing note reviewed. Exam conducted with a chaperone present.  Constitutional:      Appearance: Normal appearance. He is normal weight.  HENT:     Right Ear: Tympanic membrane normal.     Left Ear: Tympanic membrane normal.     Nose: Nose normal.     Mouth/Throat:     Mouth: Mucous membranes are moist.  Cardiovascular:     Rate and Rhythm: Normal rate and regular rhythm.     Pulses: Normal pulses.     Heart sounds: Normal heart sounds.  Pulmonary:     Effort: Pulmonary effort is normal.     Breath sounds: Normal breath sounds.  Abdominal:     General: Bowel sounds are normal.     Palpations: Abdomen is soft.  Musculoskeletal:     Cervical back: Normal range of motion and neck supple.  Skin:    General: Skin is warm and dry.     Comments: Some AK's of face and forearm  Neurological:     General: No focal deficit present.     Mental Status: He is alert and oriented to person, place, and time.  Psychiatric:        Mood and  Affect: Mood normal.        Behavior: Behavior normal.        Thought Content: Thought content normal.        Judgment: Judgment normal.  Last depression screening scores    10/15/2021    9:23 AM 10/14/2020   10:49 AM 09/08/2017    9:15 AM  PHQ 2/9 Scores  PHQ - 2 Score 0 0 2  PHQ- 9 Score 0  5   Last fall risk screening    10/15/2021    9:23 AM  [place] in the past year? 0  Number falls in past yr: 0  Injury with Fall? 0  Risk for fall due to : No Fall Risks  Follow up Falls evaluation completed   Last Audit-C alcohol use screening    10/15/2021    9:23 AM  Alcohol Use Disorder Test (AUDIT)  1. How often do you have a drink containing alcohol? 2  2. How many drinks containing alcohol do you have on a typical day when you are drinking? 0  3. How often do you have six or more drinks on one occasion? 0  AUDIT-C Score 2   A score of 3 or more in women, and 4 or more in men indicates increased risk for alcohol abuse, EXCEPT if all of the points are from question 1   No results found for any visits on 10/15/21.  Assessment & Plan    Routine Health Maintenance and Physical Exam  Exercise Activities and Dietary recommendations  Goals   None     Immunization History  Administered Date(s) Administered   Influenza Inj Mdck Quad Pf 11/22/2017   Influenza,inj,Quad PF,6+ Mos 12/03/2014, 12/20/2015, 12/08/2018   Influenza-Unspecified 12/18/2016   Tdap 11/25/2006, 10/14/2020    Health Maintenance  Topic Date Due   HIV Screening  Never done   INFLUENZA VACCINE  10/16/2022 (Originally 09/16/2021)   COVID-19 Vaccine (1) 10/16/2022 (Originally 11/16/1965)   Zoster Vaccines- Shingrix (1 of 2) 10/16/2022 (Originally 11/17/1979)   COLONOSCOPY (Pts 45-68yr Insurance coverage will need to be confirmed)  12/23/2022   TETANUS/TDAP  10/15/2030   Hepatitis C Screening  Addressed   HPV VACCINES  Aged Out    Discussed health benefits of physical activity, and  encouraged him to engage in regular exercise appropriate for his age and condition.  1. Annual physical exam  - Lipid panel - TSH - Hemoglobin A1c  2. Hyperlipidemia, unspecified hyperlipidemia type  - Lipid panel - TSH - Hemoglobin A1c  3. Gastroesophageal reflux disease, unspecified whether esophagitis present  - Lipid panel - TSH - Hemoglobin A1c  4. Hyperglycemia  - Lipid panel - TSH - Hemoglobin A1c  5. Prostate cancer screening  - PSA  6. Obstructive apnea Patient on CPAP nightly and is benefiting from use. Patient needs a new machine, auto triturating with supplies. Order placed through [facility]  Diet and exercise discussed at length.    No follow-ups on file.     I, RWilhemena Durie MD, have reviewed all documentation for this visit. The documentation on 10/18/21 for the exam, diagnosis, procedures, and orders are all accurate and complete.    Markez Dowland GCranford Mon MD  [phone](phone) [phone](fax)  CN[place]

## 2021-10-16 LAB — LIPID PANEL
Chol/HDL Ratio: 5.7 ratio — ABNORMAL HIGH (ref 0.0–5.0)
Cholesterol, Total: 198 mg/dL (ref 100–199)
HDL: 35 mg/dL — ABNORMAL LOW (ref >39)
LDL Chol Calc (NIH): 133 mg/dL — ABNORMAL HIGH (ref 0–99)
Triglycerides: 165 mg/dL — ABNORMAL HIGH (ref 0–149)
VLDL Cholesterol Cal: 30 mg/dL (ref 5–40)

## 2021-10-16 LAB — TSH: TSH: 2.51 u[IU]/mL (ref 0.450–4.500)

## 2021-10-16 LAB — HEMOGLOBIN A1C
Est. average glucose Bld gHb Est-mCnc: 126 mg/dL
Hgb A1c MFr Bld: 6 % — ABNORMAL HIGH (ref 4.8–5.6)

## 2021-10-16 LAB — PSA: Prostate Specific Ag, Serum: 0.6 ng/mL (ref 0.0–4.0)

## 2021-10-29 DIAGNOSIS — G4733 Obstructive sleep apnea (adult) (pediatric): Secondary | ICD-10-CM | POA: Diagnosis not present

## 2021-11-13 ENCOUNTER — Encounter: Payer: BC Managed Care – PPO | Admitting: Neurosurgery

## 2021-11-28 DIAGNOSIS — G4733 Obstructive sleep apnea (adult) (pediatric): Secondary | ICD-10-CM | POA: Diagnosis not present

## 2021-12-01 DIAGNOSIS — G4733 Obstructive sleep apnea (adult) (pediatric): Secondary | ICD-10-CM | POA: Diagnosis not present

## 2021-12-29 DIAGNOSIS — G4733 Obstructive sleep apnea (adult) (pediatric): Secondary | ICD-10-CM | POA: Diagnosis not present

## 2022-01-01 DIAGNOSIS — G4733 Obstructive sleep apnea (adult) (pediatric): Secondary | ICD-10-CM | POA: Diagnosis not present

## 2022-01-12 ENCOUNTER — Other Ambulatory Visit: Payer: Self-pay | Admitting: Family Medicine

## 2022-01-12 DIAGNOSIS — F4322 Adjustment disorder with anxiety: Secondary | ICD-10-CM

## 2022-01-12 MED ORDER — FINASTERIDE 5 MG PO TABS: ORAL_TABLET | ORAL | 0 refills | Status: AC

## 2022-01-12 MED ORDER — CITALOPRAM HYDROBROMIDE 20 MG PO TABS: ORAL_TABLET | ORAL | 0 refills | Status: AC

## 2022-01-12 NOTE — Telephone Encounter (Signed)
[facility] faxed refill request for the following medications:   finasteride (PROSCAR) 5 MG tablet    citalopram (CELEXA) 20 MG tablet   Please advise

## 2022-01-28 DIAGNOSIS — G4733 Obstructive sleep apnea (adult) (pediatric): Secondary | ICD-10-CM | POA: Diagnosis not present

## 2022-01-31 DIAGNOSIS — G4733 Obstructive sleep apnea (adult) (pediatric): Secondary | ICD-10-CM | POA: Diagnosis not present

## 2022-03-04 DIAGNOSIS — G4733 Obstructive sleep apnea (adult) (pediatric): Secondary | ICD-10-CM | POA: Diagnosis not present

## 2022-04-04 DIAGNOSIS — G4733 Obstructive sleep apnea (adult) (pediatric): Secondary | ICD-10-CM | POA: Diagnosis not present

## 2022-04-06 ENCOUNTER — Telehealth: Payer: Self-pay | Admitting: Family Medicine

## 2022-04-06 DIAGNOSIS — N529 Male erectile dysfunction, unspecified: Secondary | ICD-10-CM

## 2022-04-06 NOTE — Telephone Encounter (Signed)
[facility] faxed refill request for the following medications:  tadalafil (CIALIS) 20 MG tablet    Please advise.

## 2022-04-07 MED ORDER — TADALAFIL 20 MG PO TABS: ORAL_TABLET | ORAL | 2 refills | Status: DC

## 2022-04-07 NOTE — Telephone Encounter (Signed)
Last appt was 10/15/21 - No upcoming appts - pt only seen on an annual basis.    Will appt be needed to refill?

## 2022-04-13 ENCOUNTER — Telehealth: Payer: Self-pay | Admitting: Family Medicine

## 2022-04-13 NOTE — Telephone Encounter (Signed)
[facility] faxed refill request for the following medications:    tadalafil (CIALIS) 20 MG tablet   Please advise

## 2022-04-14 ENCOUNTER — Other Ambulatory Visit: Payer: Self-pay | Admitting: Family Medicine

## 2022-04-14 DIAGNOSIS — N529 Male erectile dysfunction, unspecified: Secondary | ICD-10-CM

## 2022-04-14 MED ORDER — TADALAFIL 20 MG PO TABS: ORAL_TABLET | ORAL | 2 refills | Status: AC

## 2022-04-14 NOTE — Progress Notes (Signed)
Refills for Cialis '20mg'$  sent to [facility]  Eulis Foster, MD  [facility]

## 2022-04-14 NOTE — Telephone Encounter (Signed)
Refill sent to [facility] pharmacy for cialis '20mg'$ 

## 2022-04-17 DIAGNOSIS — J069 Acute upper respiratory infection, unspecified: Secondary | ICD-10-CM | POA: Diagnosis not present

## 2022-04-17 DIAGNOSIS — I10 Essential (primary) hypertension: Secondary | ICD-10-CM | POA: Diagnosis not present

## 2022-04-17 DIAGNOSIS — Z23 Encounter for immunization: Secondary | ICD-10-CM | POA: Diagnosis not present

## 2022-04-17 DIAGNOSIS — J029 Acute pharyngitis, unspecified: Secondary | ICD-10-CM | POA: Diagnosis not present

## 2022-05-03 DIAGNOSIS — G4733 Obstructive sleep apnea (adult) (pediatric): Secondary | ICD-10-CM | POA: Diagnosis not present

## 2022-06-22 DIAGNOSIS — G4733 Obstructive sleep apnea (adult) (pediatric): Secondary | ICD-10-CM | POA: Diagnosis not present

## 2022-07-23 DIAGNOSIS — G4733 Obstructive sleep apnea (adult) (pediatric): Secondary | ICD-10-CM | POA: Diagnosis not present

## 2022-10-27 ENCOUNTER — Other Ambulatory Visit: Payer: Self-pay | Admitting: Neurosurgery

## 2022-10-30 DIAGNOSIS — Z0000 Encounter for general adult medical examination without abnormal findings: Secondary | ICD-10-CM | POA: Diagnosis not present

## 2022-10-30 DIAGNOSIS — R739 Hyperglycemia, unspecified: Secondary | ICD-10-CM | POA: Diagnosis not present

## 2022-10-30 DIAGNOSIS — E785 Hyperlipidemia, unspecified: Secondary | ICD-10-CM | POA: Diagnosis not present

## 2022-10-30 DIAGNOSIS — I10 Essential (primary) hypertension: Secondary | ICD-10-CM | POA: Diagnosis not present

## 2022-11-13 DIAGNOSIS — Z0000 Encounter for general adult medical examination without abnormal findings: Secondary | ICD-10-CM | POA: Diagnosis not present

## 2022-11-13 DIAGNOSIS — G8929 Other chronic pain: Secondary | ICD-10-CM | POA: Diagnosis not present

## 2022-11-13 DIAGNOSIS — M545 Low back pain, unspecified: Secondary | ICD-10-CM | POA: Diagnosis not present

## 2022-11-13 DIAGNOSIS — Z125 Encounter for screening for malignant neoplasm of prostate: Secondary | ICD-10-CM | POA: Diagnosis not present

## 2022-11-13 DIAGNOSIS — R739 Hyperglycemia, unspecified: Secondary | ICD-10-CM | POA: Diagnosis not present

## 2022-11-13 DIAGNOSIS — Z1389 Encounter for screening for other disorder: Secondary | ICD-10-CM | POA: Diagnosis not present

## 2022-11-13 DIAGNOSIS — E785 Hyperlipidemia, unspecified: Secondary | ICD-10-CM | POA: Diagnosis not present

## 2022-11-13 DIAGNOSIS — I10 Essential (primary) hypertension: Secondary | ICD-10-CM | POA: Diagnosis not present

## 2023-05-13 ENCOUNTER — Encounter: Payer: Self-pay | Admitting: Gastroenterology

## 2023-05-24 ENCOUNTER — Ambulatory Visit: Admitting: Anesthesiology

## 2023-05-24 ENCOUNTER — Encounter: Payer: Self-pay | Admitting: Gastroenterology

## 2023-05-24 ENCOUNTER — Encounter
Admission: RE | Disposition: A | Discharge status: Home / Self Care | Payer: Self-pay | Source: Home / Self Care | Attending: Gastroenterology

## 2023-05-24 ENCOUNTER — Ambulatory Visit
Admission: RE | Admit: 2023-05-24 | Discharge: 2023-05-24 | Disposition: A | Discharge status: Home / Self Care | Payer: BC Managed Care – PPO | Attending: Gastroenterology | Admitting: Gastroenterology

## 2023-05-24 DIAGNOSIS — F32A Depression, unspecified: Secondary | ICD-10-CM | POA: Insufficient documentation

## 2023-05-24 DIAGNOSIS — M5416 Radiculopathy, lumbar region: Secondary | ICD-10-CM | POA: Diagnosis not present

## 2023-05-24 DIAGNOSIS — R531 Weakness: Secondary | ICD-10-CM | POA: Insufficient documentation

## 2023-05-24 DIAGNOSIS — K5730 Diverticulosis of large intestine without perforation or abscess without bleeding: Secondary | ICD-10-CM | POA: Insufficient documentation

## 2023-05-24 DIAGNOSIS — G4730 Sleep apnea, unspecified: Secondary | ICD-10-CM | POA: Diagnosis not present

## 2023-05-24 DIAGNOSIS — K219 Gastro-esophageal reflux disease without esophagitis: Secondary | ICD-10-CM | POA: Insufficient documentation

## 2023-05-24 DIAGNOSIS — J45909 Unspecified asthma, uncomplicated: Secondary | ICD-10-CM | POA: Insufficient documentation

## 2023-05-24 DIAGNOSIS — Z79899 Other long term (current) drug therapy: Secondary | ICD-10-CM | POA: Diagnosis not present

## 2023-05-24 DIAGNOSIS — Z1211 Encounter for screening for malignant neoplasm of colon: Secondary | ICD-10-CM | POA: Insufficient documentation

## 2023-05-24 DIAGNOSIS — D123 Benign neoplasm of transverse colon: Secondary | ICD-10-CM | POA: Insufficient documentation

## 2023-05-24 DIAGNOSIS — F419 Anxiety disorder, unspecified: Secondary | ICD-10-CM | POA: Insufficient documentation

## 2023-05-24 DIAGNOSIS — D122 Benign neoplasm of ascending colon: Secondary | ICD-10-CM | POA: Diagnosis not present

## 2023-05-24 HISTORY — DX: Radiculopathy, lumbar region: M54.16

## 2023-05-24 HISTORY — DX: Depression, unspecified: F32.A

## 2023-05-24 HISTORY — DX: Hyperlipidemia, unspecified: E78.5

## 2023-05-24 HISTORY — DX: Endocrine disorder, unspecified: E34.9

## 2023-05-24 HISTORY — PX: POLYPECTOMY: SHX149

## 2023-05-24 HISTORY — DX: Lyme disease, unspecified: A69.20

## 2023-05-24 HISTORY — DX: Spondylolisthesis, lumbar region: M43.16

## 2023-05-24 HISTORY — DX: Anal fistula, unspecified: K60.30

## 2023-05-24 HISTORY — PX: COLONOSCOPY WITH PROPOFOL: SHX5780

## 2023-05-24 HISTORY — DX: Spondylosis without myelopathy or radiculopathy, lumbar region: M47.816

## 2023-05-24 SURGERY — COLONOSCOPY WITH PROPOFOL
Anesthesia: General

## 2023-05-24 MED ORDER — LIDOCAINE HCL (CARDIAC) PF 100 MG/5ML IV SOSY
PREFILLED_SYRINGE | INTRAVENOUS | Status: DC | PRN
  Administered 2023-05-24: 60 mg via INTRAVENOUS

## 2023-05-24 MED ORDER — PROPOFOL 10 MG/ML IV BOLUS
INTRAVENOUS | Status: AC
  Filled 2023-05-24: qty 40

## 2023-05-24 MED ORDER — PROPOFOL 10 MG/ML IV BOLUS
INTRAVENOUS | Status: DC | PRN
  Administered 2023-05-24: 40 mg via INTRAVENOUS
  Administered 2023-05-24 (×2): 30 mg via INTRAVENOUS
  Administered 2023-05-24: 80 mg via INTRAVENOUS
  Administered 2023-05-24: 40 mg via INTRAVENOUS
  Administered 2023-05-24 (×2): 30 mg via INTRAVENOUS

## 2023-05-24 MED ORDER — DEXMEDETOMIDINE HCL IN NACL 80 MCG/20ML IV SOLN
INTRAVENOUS | Status: DC | PRN
  Administered 2023-05-24: 4 ug via INTRAVENOUS

## 2023-05-24 MED ORDER — SODIUM CHLORIDE 0.9 % IV SOLN: INTRAVENOUS | Status: DC

## 2023-05-24 MED ORDER — LIDOCAINE HCL (PF) 2 % IJ SOLN
INTRAMUSCULAR | Status: AC
  Filled 2023-05-24: qty 5

## 2023-05-24 NOTE — Op Note (Signed)
 [facility] Gastroenterology Patient Name: Drew Lopez Procedure Date: 05/24/2023 10:18 AM MRN: [id number] Account #: [id number] Date of Birth: 1960/08/13 Admit Type: Outpatient Age: 63 Room: [facility] ENDO ROOM 2 Gender: Male Note Status: Finalized Instrument Name: Colonoscope [id number] Procedure:             Colonoscopy Indications:           Screening for colorectal malignant neoplasm Providers:             Jaynie Collins DO, DO Medicines:             Monitored Anesthesia Care Complications:         No immediate complications. Estimated blood loss:                         Minimal. Procedure:             Pre-Anesthesia Assessment:                        - Prior to the procedure, a History and Physical was                         performed, and patient medications and allergies were                         reviewed. The patient is competent. The risks and                         benefits of the procedure and the sedation options and                         risks were discussed with the patient. All questions                         were answered and informed consent was obtained.                         Patient identification and proposed procedure were                         verified by the physician, the nurse, the anesthetist                         and the technician in the endoscopy suite. Mental                         Status Examination: alert and oriented. Airway                         Examination: normal oropharyngeal airway and neck                         mobility. Respiratory Examination: clear to                         auscultation. CV Examination: RRR, no murmurs, no S3                         or S4. Prophylactic Antibiotics: The patient does not  require prophylactic antibiotics. Prior                         Anticoagulants: The patient has taken no anticoagulant                         or antiplatelet agents. ASA Grade  Assessment: II - A                         patient with mild systemic disease. After reviewing                         the risks and benefits, the patient was deemed in                         satisfactory condition to undergo the procedure. The                         anesthesia plan was to use monitored anesthesia care                         (MAC). Immediately prior to administration of                         medications, the patient was re-assessed for adequacy                         to receive sedatives. The heart rate, respiratory                         rate, oxygen saturations, blood pressure, adequacy of                         pulmonary ventilation, and response to care were                         monitored throughout the procedure. The physical                         status of the patient was re-assessed after the                         procedure.                        After obtaining informed consent, the colonoscope was                         passed under direct vision. Throughout the procedure,                         the patient's blood pressure, pulse, and oxygen                         saturations were monitored continuously. The                         Colonoscope was introduced through the anus and  advanced to the the terminal ileum, with                         identification of the appendiceal orifice and IC                         valve. The colonoscopy was performed without                         difficulty. The patient tolerated the procedure well.                         The quality of the bowel preparation was evaluated                         using the BBPS [facility] Bowel Preparation Scale) with                         scores of: Right Colon = 3, Transverse Colon = 3 and                         Left Colon = 3 (entire mucosa seen well with no                         residual staining, small fragments of stool or opaque                          liquid). The total BBPS score equals 9. The terminal                         ileum, ileocecal valve, appendiceal orifice, and                         rectum were photographed. Findings:      The perianal and digital rectal examinations were normal. Pertinent       negatives include normal sphincter tone.      The terminal ileum appeared normal. Estimated blood loss: none.      Retroflexion in the right colon was performed.      Multiple small-mouthed diverticula were found in the left colon.       Estimated blood loss: none.      Five sessile polyps were found in the transverse colon and ascending       colon. The polyps were 3 to 6 mm in size. These polyps were removed with       a cold snare. Resection and retrieval were complete. Estimated blood       loss was minimal.      Two sessile polyps were found in the transverse colon and ascending       colon. The polyps were 1 to 2 mm in size. These polyps were removed with       a jumbo cold forceps. Resection and retrieval were complete. Estimated       blood loss was minimal.      The exam was otherwise without abnormality on direct and retroflexion       views. Impression:            - The examined portion of the ileum was normal.                        -  Diverticulosis in the left colon.                        - Five 3 to 6 mm polyps in the transverse colon and in                         the ascending colon, removed with a cold snare.                         Resected and retrieved.                        - Two 1 to 2 mm polyps in the transverse colon and in                         the ascending colon, removed with a jumbo cold                         forceps. Resected and retrieved.                        - The examination was otherwise normal on direct and                         retroflexion views. Recommendation:        - Patient has a contact number available for                         emergencies. The signs and  symptoms of potential                         delayed complications were discussed with the patient.                         Return to normal activities tomorrow. Written                         discharge instructions were provided to the patient.                        - Discharge patient to home.                        - Resume previous diet.                        - Continue present medications.                        - No ibuprofen, naproxen, or other non-steroidal                         anti-inflammatory drugs for 5 days after polyp removal.                        - Await pathology results.                        - Repeat colonoscopy for surveillance based on  pathology results.                        - Return to referring physician as previously                         scheduled.                        - The findings and recommendations were discussed with                         the patient. Procedure Code(s):     --- Professional ---                        [phone], Colonoscopy, flexible; with removal of                         tumor(s), polyp(s), or other lesion(s) by snare                         technique                        [id number], 59, Colonoscopy, flexible; with biopsy, single                         or multiple Diagnosis Code(s):     --- Professional ---                        Z12.11, Encounter for screening for malignant neoplasm                         of colon                        D12.3, Benign neoplasm of transverse colon (hepatic                         flexure or splenic flexure)                        D12.2, Benign neoplasm of ascending colon                        K57.30, Diverticulosis of large intestine without                         perforation or abscess without bleeding CPT copyright 2022 American Medical Association. All rights reserved. The codes documented in this report are preliminary and upon coder review may  be revised to  meet current compliance requirements. Attending Participation:      I personally performed the entire procedure. Elfredia Nevins, DO Jaynie Collins DO, DO 05/24/2023 11:20:47 AM This report has been signed electronically. Number of Addenda: 0 Note Initiated On: 05/24/2023 10:18 AM Scope Withdrawal Time: 0 hours 19 minutes 3 seconds  Total Procedure Duration: 0 hours 25 minutes 38 seconds  Estimated Blood Loss:  Estimated blood loss was minimal.      [facility]

## 2023-05-24 NOTE — H&P (Signed)
 Pre-Procedure H&P   Patient ID: Drew Lopez is a 63 y.o. male.  Gastroenterology Provider: Jaynie Collins, DO  Referring Provider: Dr. Sullivan Lone PCP: Bosie Clos, MD  Date: 05/24/2023  HPI [name] is a 63 y.o. male who presents today for Colonoscopy for Colorectal cancer screening .  Last underwent colonoscopy November 2014 with internal hemorrhoids.  No family history of colon cancer or colon polyps  Hemoglobin 14 MCV 87.5 platelets 257,000 creatinine 0.9  History of anal fistula repair in 2019   Past Medical History:  Diagnosis Date   Allergy    Anal fistula    Anxiety    Arthritis    BACK-LOWER   Arthropathy of lumbar facet joint    Cancer (HCC)    BASAL CELL   Clinical depression    Complication of anesthesia    Depression    GERD (gastroesophageal reflux disease)    History of kidney stones    h/o   HLD (hyperlipidemia)    Hypotestosteronism    Low back pain    Lumbar radiculopathy    Lyme borreliosis    PONV (postoperative nausea and vomiting)    Sleep apnea    USES CPAP   Spondylolisthesis, lumbar region    Vertigo     Past Surgical History:  Procedure Laterality Date   ANAL FISTULOTOMY N/A 06/23/2017   Procedure: ANAL FISTULOTOMY;  Surgeon: Earline Mayotte, MD;  Location: [facility] ORS;  Service: General;  Laterality: N/A;   APPENDECTOMY     COLONOSCOPY  2016   Dr Mechele Collin   HERNIA REPAIR Bilateral    10   LUMBAR LAMINECTOMY/DECOMPRESSION MICRODISCECTOMY Right 08/12/2021   Procedure: RIGHT L1-2 MICRODISCECTOMY;  Surgeon: Venetia Night, MD;  Location: [facility] ORS;  Service: Neurosurgery;  Laterality: Right;   POSTERIOR CERVICAL LAMINECTOMY N/A 01/28/2018   Procedure: LEFT CERVICAL SEVEN  THORACIC ONE LAMENECTOMY/FORAMINOTOMY;  Surgeon: Donalee Citrin, MD;  Location: [facility] OR;  Service: Neurosurgery;  Laterality: N/A;   SHOULDER ARTHROSCOPY     TONSILLECTOMY     WISDOM TOOTH EXTRACTION      Family History No h/o GI disease or  malignancy  Review of Systems  Constitutional:  Negative for activity change, appetite change, chills, diaphoresis, fatigue, fever and unexpected weight change.  HENT:  Negative for trouble swallowing and voice change.   Respiratory:  Negative for shortness of breath and wheezing.   Cardiovascular:  Negative for chest pain, palpitations and leg swelling.  Gastrointestinal:  Negative for abdominal distention, abdominal pain, anal bleeding, blood in stool, constipation, diarrhea, nausea and vomiting.  Musculoskeletal:  Negative for arthralgias and myalgias.  Skin:  Negative for color change and pallor.  Neurological:  Negative for dizziness, syncope and weakness.  Psychiatric/Behavioral:  Negative for confusion. The patient is not nervous/anxious.   All other systems reviewed and are negative.    Medications No current facility-administered medications on file prior to encounter.   Current Outpatient Medications on File Prior to Encounter  Medication Sig Dispense Refill   citalopram (CELEXA) 20 MG tablet TAKE 1 TABLET(20 MG) BY MOUTH DAILY 90 tablet 0   omeprazole (PRILOSEC) 20 MG capsule Take 1 capsule (20 mg total) by mouth daily. TAKE 1 CAPSULE(20 MG) BY MOUTH DAILY 90 capsule 3   docusate sodium (COLACE) 100 MG capsule Take 100 mg by mouth daily as needed for mild constipation.     fexofenadine (ALLEGRA) 180 MG tablet Take 180 mg by mouth at bedtime.  finasteride (PROSCAR) 5 MG tablet TAKE 1 TABLET(5 MG) BY MOUTH DAILY 90 tablet 0   Multiple Vitamin tablet Take 1 tablet by mouth daily.      tadalafil (CIALIS) 20 MG tablet SMARTSIG:0.5-1 Tablet(s) By Mouth Every Other Day PRN 10 tablet 2    Pertinent medications related to GI and procedure were reviewed by me with the patient prior to the procedure   Current Facility-Administered Medications:    0.9 %  sodium chloride infusion, , Intravenous, Continuous, Jaynie Collins, DO, Last Rate: 20 mL/hr at 05/24/23 1036, New Bag at  05/24/23 1036  sodium chloride 20 mL/hr at 05/24/23 1036       Allergies  Allergen Reactions   Promethazine Other (See Comments)    Makes pt VERY JITTERY Makes patient very jittery  Makes pt VERY JITTERY   Allergies were reviewed by me prior to the procedure  Objective   Body mass index is 32.25 kg/m. Vitals:   05/24/23 1030  BP: (!) 152/82  Pulse: 87  Resp: 16  Temp: (!) 97.2 F (36.2 C)  TempSrc: Temporal  SpO2: 96%  Weight: 107.9 kg  Height: 6' (1.829 m)     Physical Exam Vitals and nursing note reviewed.  Constitutional:      General: He is not in acute distress.    Appearance: Normal appearance. He is not ill-appearing, toxic-appearing or diaphoretic.  HENT:     Head: Normocephalic and atraumatic.     Nose: Nose normal.     Mouth/Throat:     Mouth: Mucous membranes are moist.     Pharynx: Oropharynx is clear.  Eyes:     General: No scleral icterus.    Extraocular Movements: Extraocular movements intact.  Cardiovascular:     Rate and Rhythm: Normal rate and regular rhythm.     Heart sounds: Normal heart sounds. No murmur heard.    No friction rub. No gallop.  Pulmonary:     Effort: Pulmonary effort is normal. No respiratory distress.     Breath sounds: Normal breath sounds. No wheezing, rhonchi or rales.  Abdominal:     General: Bowel sounds are normal. There is no distension.     Palpations: Abdomen is soft.     Tenderness: There is no abdominal tenderness. There is no guarding or rebound.  Musculoskeletal:     Cervical back: Neck supple.     Right lower leg: No edema.     Left lower leg: No edema.  Skin:    General: Skin is warm and dry.     Coloration: Skin is not jaundiced or pale.  Neurological:     General: No focal deficit present.     Mental Status: He is alert and oriented to person, place, and time. Mental status is at baseline.  Psychiatric:        Mood and Affect: Mood normal.        Behavior: Behavior normal.        Thought  Content: Thought content normal.        Judgment: Judgment normal.      Assessment:  [name] is a 63 y.o. male  who presents today for Colonoscopy for Colorectal cancer screening .  Plan:  Colonoscopy with possible intervention today  Colonoscopy with possible biopsy, control of bleeding, polypectomy, and interventions as necessary has been discussed with the patient/patient representative. Informed consent was obtained from the patient/patient representative after explaining the indication, nature, and risks of the procedure including but  not limited to death, bleeding, perforation, missed neoplasm/lesions, cardiorespiratory compromise, and reaction to medications. Opportunity for questions was given and appropriate answers were provided. Patient/patient representative has verbalized understanding is amenable to undergoing the procedure.   Jaynie Collins, DO  [facility] Gastroenterology  Portions of the record may have been created with voice recognition software. Occasional wrong-word or 'sound-a-like' substitutions may have occurred due to the inherent limitations of voice recognition software.  Read the chart carefully and recognize, using context, where substitutions may have occurred.

## 2023-05-24 NOTE — Anesthesia Postprocedure Evaluation (Signed)
 Anesthesia Post Note  Patient: Drew Lopez  Procedure(s) Performed: COLONOSCOPY WITH PROPOFOL POLYPECTOMY, INTESTINE  Patient location during evaluation: Endoscopy Anesthesia Type: General Level of consciousness: awake and alert Pain management: pain level controlled Vital Signs Assessment: post-procedure vital signs reviewed and stable Respiratory status: spontaneous breathing, nonlabored ventilation, respiratory function stable and patient connected to nasal cannula oxygen Cardiovascular status: blood pressure returned to baseline and stable Postop Assessment: no apparent nausea or vomiting Anesthetic complications: no  No notable events documented.   Last Vitals:  Vitals:   05/24/23 1126 05/24/23 1136  BP: (!) 143/82 (!) 140/81  Pulse: 74 68  Resp: 14 18  Temp:    SpO2: 96% 97%    Last Pain:  Vitals:   05/24/23 1136  TempSrc:   PainSc: 0-No pain                 Stephanie Coup

## 2023-05-24 NOTE — Transfer of Care (Signed)
 Immediate Anesthesia Transfer of Care Note  Patient: Drew Lopez  Procedure(s) Performed: COLONOSCOPY WITH PROPOFOL POLYPECTOMY, INTESTINE  Patient Location: Endoscopy Unit  Anesthesia Type:General  Level of Consciousness: awake, alert , oriented, and patient cooperative  Airway & Oxygen Therapy: Patient Spontanous Breathing  Post-op Assessment: Report given to RN and Post -op Vital signs reviewed and stable  Post vital signs: Reviewed and stable  Last Vitals:  Vitals Value Taken Time  BP 114/72 05/24/23 1117  Temp 36.2 C 05/24/23 1116  Pulse 71 05/24/23 1117  Resp 15 05/24/23 1117  SpO2 96 % 05/24/23 1117  Vitals shown include unfiled device data.  Last Pain:  Vitals:   05/24/23 1116  TempSrc: Tympanic  PainSc: Asleep         Complications: No notable events documented.

## 2023-05-24 NOTE — Anesthesia Preprocedure Evaluation (Signed)
 Anesthesia Evaluation  Patient identified by MRN, date of birth, ID band Patient awake    Reviewed: Allergy & Precautions, NPO status , Patient's Chart, lab work & pertinent test results  History of Anesthesia Complications (+) PONV and history of anesthetic complications  Airway Mallampati: III  TM Distance: >3 FB Neck ROM: full    Dental  (+) Chipped   Pulmonary asthma , sleep apnea and Continuous Positive Airway Pressure Ventilation    Pulmonary exam normal        Cardiovascular (-) angina (-) Past MI and (-) DOE negative cardio ROS Normal cardiovascular exam     Neuro/Psych  PSYCHIATRIC DISORDERS Anxiety Depression    Lumbar radiculopathy  Right leg weakness  S/p C7-T1 laminectomy  Neuromuscular disease    GI/Hepatic Neg liver ROS,GERD  ,,  Endo/Other  negative endocrine ROS    Renal/GU      Musculoskeletal   Abdominal   Peds  Hematology negative hematology ROS (+)   Anesthesia Other Findings Past Medical History: No date: Allergy No date: Anxiety No date: Arthritis     Comment:  BACK-LOWER No date: Cancer (HCC)     Comment:  BASAL CELL No date: Complication of anesthesia No date: GERD (gastroesophageal reflux disease) No date: History of kidney stones     Comment:  h/o No date: Low back pain No date: PONV (postoperative nausea and vomiting) No date: Sleep apnea     Comment:  USES CPAP No date: Vertigo  Past Surgical History: 06/23/2017: ANAL FISTULOTOMY; N/A     Comment:  Procedure: ANAL FISTULOTOMY;  Surgeon: Earline Mayotte, MD;  Location: [facility] ORS;  Service: General;                Laterality: N/A; No date: APPENDECTOMY 2016: COLONOSCOPY     Comment:  Dr Mechele Collin No date: HERNIA REPAIR; Bilateral     Comment:  10 01/28/2018: POSTERIOR CERVICAL LAMINECTOMY; N/A     Comment:  Procedure: LEFT CERVICAL SEVEN  THORACIC ONE               LAMENECTOMY/FORAMINOTOMY;  Surgeon:  Donalee Citrin, MD;                Location: [facility] OR;  Service: Neurosurgery;  Laterality:               N/A; No date: SHOULDER ARTHROSCOPY No date: TONSILLECTOMY No date: WISDOM TOOTH EXTRACTION     Reproductive/Obstetrics negative OB ROS                             Anesthesia Physical Anesthesia Plan  ASA: 2  Anesthesia Plan: General   Post-op Pain Management: Minimal or no pain anticipated   Induction: Intravenous  PONV Risk Score and Plan: 3 and Propofol infusion, TIVA and Ondansetron  Airway Management Planned: Nasal Cannula  Additional Equipment: None  Intra-op Plan:   Post-operative Plan:   Informed Consent: I have reviewed the patients History and Physical, chart, labs and discussed the procedure including the risks, benefits and alternatives for the proposed anesthesia with the patient or authorized representative who has indicated his/her understanding and acceptance.     Dental advisory given  Plan Discussed with: CRNA and Surgeon  Anesthesia Plan Comments: (Discussed risks of anesthesia with patient, including possibility of difficulty with spontaneous ventilation under anesthesia necessitating airway intervention, PONV, and rare  risks such as cardiac or respiratory or neurological events, and allergic reactions. Discussed the role of CRNA in patient's perioperative care. Patient understands.)       Anesthesia Quick Evaluation

## 2023-05-24 NOTE — Interval H&P Note (Signed)
 History and Physical Interval Note: Preprocedure H&P from 05/24/23  was reviewed and there was no interval change after seeing and examining the patient.  Written consent was obtained from the patient after discussion of risks, benefits, and alternatives. Patient has consented to proceed with Colonoscopy with possible intervention   05/24/2023 10:37 AM  Drew Lopez  has presented today for surgery, with the diagnosis of Z12.11 (ICD-10-CM) - Colon cancer screening.  The various methods of treatment have been discussed with the patient and family. After consideration of risks, benefits and other options for treatment, the patient has consented to  Procedure(s): COLONOSCOPY WITH PROPOFOL (N/A) as a surgical intervention.  The patient's history has been reviewed, patient examined, no change in status, stable for surgery.  I have reviewed the patient's chart and labs.  Questions were answered to the patient's satisfaction.     Jaynie Collins

## 2023-05-25 ENCOUNTER — Encounter: Payer: Self-pay | Admitting: Gastroenterology

## 2023-05-25 LAB — SURGICAL PATHOLOGY

## 2023-09-29 ENCOUNTER — Encounter: Payer: Self-pay | Admitting: Physician Assistant

## 2023-09-29 NOTE — Progress Notes (Unsigned)
 Referring Physician:  Bertrum Charlie CROME, MD [address] [place],  [place] [address]  Primary Physician:  Bertrum Charlie CROME, MD  History of Present Illness: 10/05/2023 [name] is here today with a chief complaint of neck pain radiating down his right arm into the his first three digits causing associated numbness, weakness, tingling for almost 3 weeks.  Previous history of C7 and T1 posterior cervical foraminotomy discectomy 6 years ago.  He has noticed difficulty lifting up his right arm as well as decreased grip strength.  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  He did do a prednisone  course which helped some of his pain.  He does endorse some cramping in his right upper extremity.  He is in quite severe pain and has been taking hydrocodone .  No changes to his gait.  No issues with incontinence.    Conservative measures:  Physical therapy:  has not participated in Multimodal medical therapy including regular antiinflammatories:  Gabapentin , Percocet, Robaxin , Tramadol , Hydrocodone  Injections:  no epidural steroid injections  Past Surgery:  01/31/18 -s/p posterior cervical foraminatomy/discectomy C7-T1 with Onetha  08/12/21- LUMBAR LAMINECTOMY/DECOMPRESSION MICRODISCECTOMY, Dr. Clois Medford DELENA Edin has no symptoms of cervical myelopathy.  The symptoms are causing a significant impact on the patient's life.   Review of Systems:  A 10 point review of systems is negative, except for the pertinent positives and negatives detailed in the HPI.  Past Medical History: Past Medical History:  Diagnosis Date   Allergy    Anal fistula    Anxiety    Arthritis    BACK-LOWER   Arthropathy of lumbar facet joint    Cancer (HCC)    BASAL CELL   Clinical depression    Complication of anesthesia    Depression    GERD (gastroesophageal reflux disease)    History of kidney stones    h/o   HLD (hyperlipidemia)     Hypotestosteronism    Low back pain    Lumbar radiculopathy    Lyme borreliosis    PONV (postoperative nausea and vomiting)    Sleep apnea    USES CPAP   Spondylolisthesis, lumbar region    Vertigo     Past Surgical History: Past Surgical History:  Procedure Laterality Date   ANAL FISTULOTOMY N/A 06/23/2017   Procedure: ANAL FISTULOTOMY;  Surgeon: Dessa Reyes ORN, MD;  Location: [facility] ORS;  Service: General;  Laterality: N/A;   APPENDECTOMY     COLONOSCOPY  2016   Dr Viktoria   COLONOSCOPY WITH PROPOFOL  N/A 05/24/2023   Procedure: COLONOSCOPY WITH PROPOFOL ;  Surgeon: Onita Elspeth Sharper, DO;  Location: [facility] ENDOSCOPY;  Service: Gastroenterology;  Laterality: N/A;   HERNIA REPAIR Bilateral    10   LUMBAR LAMINECTOMY/DECOMPRESSION MICRODISCECTOMY Right 08/12/2021   Procedure: RIGHT L1-2 MICRODISCECTOMY;  Surgeon: Clois Fret, MD;  Location: [facility] ORS;  Service: Neurosurgery;  Laterality: Right;   POLYPECTOMY  05/24/2023   Procedure: POLYPECTOMY, INTESTINE;  Surgeon: Onita Elspeth Sharper, DO;  Location: [facility] ENDOSCOPY;  Service: Gastroenterology;;   POSTERIOR CERVICAL LAMINECTOMY N/A 01/28/2018   Procedure: LEFT CERVICAL SEVEN  THORACIC ONE LAMENECTOMY/FORAMINOTOMY;  Surgeon: Onetha Kuba, MD;  Location: [facility] OR;  Service: Neurosurgery;  Laterality: N/A;   SHOULDER ARTHROSCOPY     TONSILLECTOMY     WISDOM TOOTH EXTRACTION      Allergies: Allergies as of 10/05/2023 - Review Complete 10/05/2023  Allergen Reaction Noted   Promethazine Other (  See Comments) 11/30/2014    Medications: Outpatient Encounter Medications as of 10/05/2023  Medication Sig   citalopram  (CELEXA ) 20 MG tablet TAKE 1 TABLET(20 MG) BY MOUTH DAILY   docusate sodium (COLACE) 100 MG capsule Take 100 mg by mouth daily as needed for mild constipation.   fexofenadine (ALLEGRA) 180 MG tablet Take 180 mg by mouth at bedtime.    finasteride  (PROSCAR ) 5 MG tablet TAKE 1 TABLET(5 MG) BY MOUTH DAILY    HYDROcodone -acetaminophen  (NORCO/VICODIN) 5-325 MG tablet Take 1 tablet by mouth every 4 (four) hours as needed.   Multiple Vitamin tablet Take 1 tablet by mouth daily.    omeprazole  (PRILOSEC) 20 MG capsule Take 1 capsule (20 mg total) by mouth daily. TAKE 1 CAPSULE(20 MG) BY MOUTH DAILY   tadalafil  (CIALIS ) 20 MG tablet SMARTSIG:0.5-1 Tablet(s) By Mouth Every Other Day PRN   No facility-administered encounter medications on file as of 10/05/2023.    Social History: Social History   Tobacco Use   Smoking status: Never   Smokeless tobacco: Never  Vaping Use   Vaping status: Never Used  Substance Use Topics   Alcohol use: Yes   Drug use: No    Family Medical History: Family History  Problem Relation Age of Onset   Alzheimer's disease Father     Physical Examination: @VITALWITHPAIN @  General: Patient is well developed, well nourished, calm, collected, and in no apparent distress. Attention to examination is appropriate.  Psychiatric: Patient is non-anxious.  Head:  Pupils equal, round, and reactive to light.  ENT:  Oral mucosa appears well hydrated.  Neck:   Supple.   Respiratory: Patient is breathing without any difficulty.  Extremities: No edema.  Vascular: Palpable dorsal pedal pulses.  Skin:   On exposed skin, there are no abnormal skin lesions.  NEUROLOGICAL:     Awake, alert, oriented to person, place, and time.  Speech is clear and fluent. Fund of knowledge is appropriate.   Cranial Nerves: Pupils equal round and reactive to light.  Facial tone is symmetric.   ROM of spine: Tenderness palpation of cervical paraspinals  Positive Spurling's.  Strength: Side Biceps Triceps Deltoid Interossei Grip Wrist Ext. Wrist Flex.  R 4 5 4- 5 4+ 5 4  L 5 5 5 5 5 5 5     Reflexes are 2+ and symmetric at the biceps, triceps, brachioradialis.  Hoffman's is absent.  Slight sensation discrepancy in right thumb compared to the left.  Otherwise sensation is intact. Gait  is normal.   No difficulty with tandem gait.   No evidence of dysmetria noted.  Medical Decision Making  Imaging:   I have personally reviewed the images and agree with the above interpretation.  Assessment and Plan: Mr. Hyser is a pleasant 63 y.o. male is here today with a chief complaint of neck pain radiating down his right arm into the his first three digits causing associated numbness, weakness, tingling for almost 3 weeks.  Previous history of C7 and T1 posterior cervical foraminotomy discectomy 6 years ago.  He has noticed difficulty lifting up his right arm as well as decreased grip strength.  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  He did do a prednisone  course which helped some of his pain.  He does endorse some cramping in his right upper extremity.  He is in quite severe pain and has been taking hydrocodone .  On examination, patient does have some weakness in his right  upper extremity.  Positive Spurling's.  Pleasure to see patient in clinic today.  Patient is likely suffering from cervical radiculopathy.  Concern given new weakness.  Cervical MRI has been ordered.  Will review results once complete.  Patient has had previous surgery with Dr. Katrina and we will plan to follow-up with him.  I will help coordinate this following his MRI.  Encourage patient to continue doing his physical therapy exercises.    Thank you for involving me in the care of this patient.   I spent a total of 45 minutes in both face-to-face and non-face-to-face activities for this visit on the date of this encounter including preparing to see the patient, obtaining and reviewing separately obtained history, performing medically appropriate examination, counseling the patient, ordering additional  tests, documenting clinical information,  coordination of care.   Lyle Decamp, PA-C Dept. of Neurosurgery

## 2023-10-05 ENCOUNTER — Ambulatory Visit: Admitting: Physician Assistant

## 2023-10-05 ENCOUNTER — Ambulatory Visit
Admission: RE | Admit: 2023-10-05 | Discharge: 2023-10-05 | Disposition: A | Discharge status: Home / Self Care | Attending: Physician Assistant | Admitting: Physician Assistant

## 2023-10-05 ENCOUNTER — Encounter: Payer: Self-pay | Admitting: Physician Assistant

## 2023-10-05 ENCOUNTER — Ambulatory Visit
Admission: RE | Admit: 2023-10-05 | Discharge: 2023-10-05 | Disposition: A | Discharge status: Home / Self Care | Source: Ambulatory Visit | Attending: Physician Assistant | Admitting: Physician Assistant

## 2023-10-05 VITALS — BP 164/102 | Ht 72.0 in | Wt 242.0 lb

## 2023-10-05 DIAGNOSIS — R2991 Unspecified symptoms and signs involving the musculoskeletal system: Secondary | ICD-10-CM

## 2023-10-05 DIAGNOSIS — M5010 Cervical disc disorder with radiculopathy, unspecified cervical region: Secondary | ICD-10-CM

## 2023-10-05 DIAGNOSIS — R200 Anesthesia of skin: Secondary | ICD-10-CM

## 2023-10-05 DIAGNOSIS — M542 Cervicalgia: Secondary | ICD-10-CM | POA: Diagnosis not present

## 2023-10-07 ENCOUNTER — Encounter: Payer: Self-pay | Admitting: Physician Assistant

## 2023-10-11 ENCOUNTER — Ambulatory Visit: Admitting: Orthopedic Surgery

## 2023-10-11 ENCOUNTER — Ambulatory Visit
Admission: RE | Admit: 2023-10-11 | Discharge: 2023-10-11 | Disposition: A | Discharge status: Home / Self Care | Source: Ambulatory Visit | Attending: Physician Assistant | Admitting: Physician Assistant

## 2023-10-11 DIAGNOSIS — M5010 Cervical disc disorder with radiculopathy, unspecified cervical region: Secondary | ICD-10-CM

## 2023-10-19 ENCOUNTER — Ambulatory Visit: Payer: Self-pay | Admitting: Physician Assistant

## 2023-10-22 NOTE — H&P (View-Only) (Signed)
 Referring Physician:  Bertrum Charlie CROME, MD [address] [place],  [place] [address]  Primary Physician:  Bertrum Charlie CROME, MD  History of Present Illness: 10/26/2023 Drew Lopez is here today with a chief complaint of severe neck and arm pain.  He experiences severe neck pain radiating to the shoulder and down the right arm, with weakness in abduction and decreased grip strength, approximately forty pounds less on the right side. Numbness is present in the forearm and all five fingers, exacerbated by activities such as typing or reading. Pain is localized across the top of the shoulder blade and down the medial border, correlating with arm symptoms.  He has undergone a posterior discectomy, which previously alleviated his pain. Current diagnoses include multilevel foraminal stenosis from C4 to T1, with moderate stenosis at C4-5, C5-6, C6-7, and C7-T1, cervical kyphosis, and significant arthritis at C5-6, C6-7, and C7-T1.  He discontinued hydrocodone  and oxycodone  after three weeks due to ineffectiveness and adverse effects. Traction and exercises have not provided significant relief. 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    10/05/2023 Note from [place], [place] Drew Lopez is here today with a chief complaint of neck pain radiating down his right arm into the his first three digits causing associated numbness, weakness, tingling for almost 3 weeks.  Previous history of C7 and T1 posterior cervical foraminotomy discectomy 6 years ago.  He has noticed difficulty lifting up his right arm as well as decreased grip strength.  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  He did do a prednisone  course which helped some of his pain.  He does endorse some cramping in his right upper extremity.  He is in quite severe pain  and has been taking hydrocodone .  No changes to his gait.  No issues with incontinence.       Conservative measures:  Physical therapy:  has not participated in Multimodal medical therapy including regular antiinflammatories:  Gabapentin , Percocet, Robaxin , Tramadol , Hydrocodone  Injections:  no epidural steroid injections   Past Surgery:  01/31/18 -s/p posterior cervical foraminatomy/discectomy C7-T1 with Onetha  08/12/21- LUMBAR LAMINECTOMY/DECOMPRESSION MICRODISCECTOMY, Dr. Clois    Review of Systems:  A 10 point review of systems is negative, except for the pertinent positives and negatives detailed in the HPI.  Past Medical History: Past Medical History:  Diagnosis Date   Allergy    Anal fistula    Anxiety    Arthritis    BACK-LOWER   Arthropathy of lumbar facet joint    Cancer (HCC)    BASAL CELL   Clinical depression    Complication of anesthesia    Depression    GERD (gastroesophageal reflux disease)    History of kidney stones    h/o   HLD (hyperlipidemia)    Hypotestosteronism    Low back pain    Lumbar radiculopathy    Lyme borreliosis    PONV (postoperative nausea and vomiting)    Sleep apnea    USES CPAP   Spondylolisthesis, lumbar region    Vertigo     Past Surgical History: Past Surgical History:  Procedure Laterality Date   ANAL FISTULOTOMY N/A 06/23/2017   Procedure: ANAL FISTULOTOMY;  Surgeon: Dessa Reyes ORN, MD;  Location: [facility] ORS;  Service: General;  Laterality: N/A;   APPENDECTOMY     COLONOSCOPY  2016   Dr Viktoria   COLONOSCOPY WITH PROPOFOL  N/A 05/24/2023   Procedure: COLONOSCOPY WITH PROPOFOL ;  Surgeon: Onita Elspeth Sharper, DO;  Location: [facility] ENDOSCOPY;  Service: Gastroenterology;  Laterality: N/A;   HERNIA REPAIR Bilateral    10   LUMBAR LAMINECTOMY/DECOMPRESSION MICRODISCECTOMY Right 08/12/2021   Procedure: RIGHT L1-2 MICRODISCECTOMY;  Surgeon: Clois Fret, MD;  Location: [facility] ORS;  Service: Neurosurgery;   Laterality: Right;   POLYPECTOMY  05/24/2023   Procedure: POLYPECTOMY, INTESTINE;  Surgeon: Onita Elspeth Sharper, DO;  Location: [facility] END[facility]COPY;  Service: Gastroenterology;;   POSTERIOR CERVICAL LAMINECTOMY N/A 01/28/2018   Procedure: LEFT CERVICAL SEVEN  THORACIC ONE LAMENECTOMY/FORAMINOTOMY;  Surgeon: Onetha Kuba, MD;  Location: [facility] OR;  Service: Neurosurgery;  Laterality: N/A;   SHOULDER ARTHROSCOPY     TONSILLECTOMY     WISDOM TOOTH EXTRACTION      Allergies: Allergies as of 10/26/2023 - Review Complete 10/26/2023  Allergen Reaction Noted   Promethazine Other (See Comments) 11/30/2014    Medications:  Current Outpatient Medications:    citalopram  (CELEXA ) 20 MG tablet, TAKE 1 TABLET(20 MG) BY MOUTH DAILY, Disp: 90 tablet, Rfl: 0   docusate sodium (COLACE) 100 MG capsule, Take 100 mg by mouth daily as needed for mild constipation., Disp: , Rfl:    fexofenadine (ALLEGRA) 180 MG tablet, Take 180 mg by mouth at bedtime. , Disp: , Rfl:    finasteride  (PROSCAR ) 5 MG tablet, TAKE 1 TABLET(5 MG) BY MOUTH DAILY, Disp: 90 tablet, Rfl: 0   Multiple Vitamin tablet, Take 1 tablet by mouth daily. , Disp: , Rfl:    omeprazole  (PRILOSEC) 20 MG capsule, Take 1 capsule (20 mg total) by mouth daily. TAKE 1 CAPSULE(20 MG) BY MOUTH DAILY, Disp: 90 capsule, Rfl: 3   tadalafil  (CIALIS ) 20 MG tablet, SMARTSIG:0.5-1 Tablet(s) By Mouth Every Other Day PRN, Disp: 10 tablet, Rfl: 2   amLODipine (NORVASC) 5 MG tablet, Take 5 mg by mouth. (Patient not taking: Reported on 10/26/2023), Disp: , Rfl:   Social History: Social History   Tobacco Use   Smoking status: Never   Smokeless tobacco: Never  Vaping Use   Vaping status: Never Used  Substance Use Topics   Alcohol use: Yes   Drug use: No    Family Medical History: Family History  Problem Relation Age of Onset   Alzheimer's disease Father     Physical Examination: Vitals:   10/26/23 1053  BP: (!) 152/84    General: Patient is in no apparent  distress. Attention to examination is appropriate.  Neck:   Supple.  Full range of motion.  Respiratory: Patient is breathing without any difficulty.   NEUROLOGICAL:     Awake, alert, oriented to person, place, and time.  Speech is clear and fluent.   Cranial Nerves: Pupils equal round and reactive to light.  Facial tone is symmetric.  Facial sensation is symmetric. Shoulder shrug is symmetric. Tongue protrusion is midline.  There is no pronator drift.  Strength: Side Biceps Triceps Deltoid Interossei Grip Wrist Ext. Wrist Flex.  R 4 4 4  4- 4- 4+ 4+  L 5 5 5 5 5 5 5    Side Iliopsoas Quads Hamstring PF DF EHL  R 5 5 5 5 5 5   L 5 5 5 5 5 5    Reflexes are 1+ and symmetric at the biceps, triceps, brachioradialis, patella and achilles.   Hoffman's is absent.   Bilateral upper and lower extremity sensation is intact to light touch.  No evidence of dysmetria noted.  Gait is normal.    Grip strength 55 pounds in the right arm and 97 pounds in the left arm.  Medical Decision Making  Imaging: MRI C spine 10/11/2023 IMPRESSION: 1. Multilevel cervical spondylosis without significant spinal stenosis. 2. Multifactorial degenerative changes with resultant moderate to severe bilateral C4 through C8 foraminal narrowing as detailed above. 3. Postoperative changes from prior left-sided micro discectomy at C7-T1.     Electronically Signed   By: Morene Hoard M.D.   On: 10/16/2023 01:52   Xrays of the cervical spine show 4.6 mm of anterior translation of C4 and C5 during flexion that reduces to 2-1/2 mm on extension.  He also has reversal of the normal cervical lordosis.  I have personally reviewed the images and agree with the above interpretation.  Assessment and Plan: Mr. Eshelman is a pleasant 63 y.o. male with multilevel cervical radiculopathies with objective weakness in his right arm.  He is right-hand dominant and has a substantial loss of strength in his right hand.     He has cervical kyphosis in addition to radiculopathy and weakness due to multilevel cervical stenosis.  Given the level of weakness he has in his right arm, conservative management is not indicated.  I have recommended surgical intervention.  We discussed the various strategies.  To address his kyphosis and stenosis, I have recommended C4-7 anterior cervical discectomy and fusion.  I am concerned that C7-T1 will not be reachable, so we will plan for right sided C7-T1 foraminotomies at the same time.  I discussed the planned procedure at length with the patient, including the risks, benefits, alternatives, and indications. The risks discussed include but are not limited to bleeding, infection, need for reoperation, spinal fluid leak, stroke, vision loss, anesthetic complication, coma, paralysis, and even death. We also discussed the possibility of post-operative dysphagia, vocal cord paralysis, and the risk of adjacent segment disease in the future. I also described in detail that improvement was not guaranteed.  The patient expressed understanding of these risks, and asked that we proceed with surgery. I described the surgery in layman's terms, and gave ample opportunity for questions, which were answered to the best of my ability.  I spent a total of 30 minutes in this patient's care today. This time was spent reviewing pertinent records including imaging studies, obtaining and confirming history, performing a directed evaluation, formulating and discussing my recommendations, and documenting the visit within the medical record.      Thank you for involving me in the care of this patient.      Lasaro Primm K. Clois MD, [facility] Neurosurgery

## 2023-10-22 NOTE — Progress Notes (Signed)
 Referring Physician:  Bertrum Charlie CROME, MD [address] [place],  [place] [address]  Primary Physician:  Bertrum Charlie CROME, MD  History of Present Illness: 10/26/2023 Drew Lopez is here today with a chief complaint of severe neck and arm pain.  He experiences severe neck pain radiating to the shoulder and down the right arm, with weakness in abduction and decreased grip strength, approximately forty pounds less on the right side. Numbness is present in the forearm and all five fingers, exacerbated by activities such as typing or reading. Pain is localized across the top of the shoulder blade and down the medial border, correlating with arm symptoms.  He has undergone a posterior discectomy, which previously alleviated his pain. Current diagnoses include multilevel foraminal stenosis from C4 to T1, with moderate stenosis at C4-5, C5-6, C6-7, and C7-T1, cervical kyphosis, and significant arthritis at C5-6, C6-7, and C7-T1.  He discontinued hydrocodone  and oxycodone  after three weeks due to ineffectiveness and adverse effects. Traction and exercises have not provided significant relief. 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    10/05/2023 Note from [place], [place] Drew Lopez is here today with a chief complaint of neck pain radiating down his right arm into the his first three digits causing associated numbness, weakness, tingling for almost 3 weeks.  Previous history of C7 and T1 posterior cervical foraminotomy discectomy 6 years ago.  He has noticed difficulty lifting up his right arm as well as decreased grip strength.  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  He did do a prednisone  course which helped some of his pain.  He does endorse some cramping in his right upper extremity.  He is in quite severe pain  and has been taking hydrocodone .  No changes to his gait.  No issues with incontinence.       Conservative measures:  Physical therapy:  has not participated in Multimodal medical therapy including regular antiinflammatories:  Gabapentin , Percocet, Robaxin , Tramadol , Hydrocodone  Injections:  no epidural steroid injections   Past Surgery:  01/31/18 -s/p posterior cervical foraminatomy/discectomy C7-T1 with Onetha  08/12/21- LUMBAR LAMINECTOMY/DECOMPRESSION MICRODISCECTOMY, Dr. Clois    Review of Systems:  A 10 point review of systems is negative, except for the pertinent positives and negatives detailed in the HPI.  Past Medical History: Past Medical History:  Diagnosis Date   Allergy    Anal fistula    Anxiety    Arthritis    BACK-LOWER   Arthropathy of lumbar facet joint    Cancer (HCC)    BASAL CELL   Clinical depression    Complication of anesthesia    Depression    GERD (gastroesophageal reflux disease)    History of kidney stones    h/o   HLD (hyperlipidemia)    Hypotestosteronism    Low back pain    Lumbar radiculopathy    Lyme borreliosis    PONV (postoperative nausea and vomiting)    Sleep apnea    USES CPAP   Spondylolisthesis, lumbar region    Vertigo     Past Surgical History: Past Surgical History:  Procedure Laterality Date   ANAL FISTULOTOMY N/A 06/23/2017   Procedure: ANAL FISTULOTOMY;  Surgeon: Dessa Reyes ORN, MD;  Location: [facility] ORS;  Service: General;  Laterality: N/A;   APPENDECTOMY     COLONOSCOPY  2016   Dr Viktoria   COLONOSCOPY WITH PROPOFOL  N/A 05/24/2023   Procedure: COLONOSCOPY WITH PROPOFOL ;  Surgeon: Onita Elspeth Sharper, DO;  Location: [facility] ENDOSCOPY;  Service: Gastroenterology;  Laterality: N/A;   HERNIA REPAIR Bilateral    10   LUMBAR LAMINECTOMY/DECOMPRESSION MICRODISCECTOMY Right 08/12/2021   Procedure: RIGHT L1-2 MICRODISCECTOMY;  Surgeon: Clois Fret, MD;  Location: [facility] ORS;  Service: Neurosurgery;   Laterality: Right;   POLYPECTOMY  05/24/2023   Procedure: POLYPECTOMY, INTESTINE;  Surgeon: Onita Elspeth Sharper, DO;  Location: [facility];  Service: Gastroenterology;;   POSTERIOR CERVICAL LAMINECTOMY N/A 01/28/2018   Procedure: LEFT CERVICAL SEVEN  THORACIC ONE LAMENECTOMY/FORAMINOTOMY;  Surgeon: Onetha Kuba, MD;  Location: [facility] OR;  Service: Neurosurgery;  Laterality: N/A;   SHOULDER ARTHROSCOPY     TONSILLECTOMY     WISDOM TOOTH EXTRACTION      Allergies: Allergies as of 10/26/2023 - Review Complete 10/26/2023  Allergen Reaction Noted   Promethazine Other (See Comments) 11/30/2014    Medications:  Current Outpatient Medications:    citalopram  (CELEXA ) 20 MG tablet, TAKE 1 TABLET(20 MG) BY MOUTH DAILY, Disp: 90 tablet, Rfl: 0   docusate sodium (COLACE) 100 MG capsule, Take 100 mg by mouth daily as needed for mild constipation., Disp: , Rfl:    fexofenadine (ALLEGRA) 180 MG tablet, Take 180 mg by mouth at bedtime. , Disp: , Rfl:    finasteride  (PROSCAR ) 5 MG tablet, TAKE 1 TABLET(5 MG) BY MOUTH DAILY, Disp: 90 tablet, Rfl: 0   Multiple Vitamin tablet, Take 1 tablet by mouth daily. , Disp: , Rfl:    omeprazole  (PRILOSEC) 20 MG capsule, Take 1 capsule (20 mg total) by mouth daily. TAKE 1 CAPSULE(20 MG) BY MOUTH DAILY, Disp: 90 capsule, Rfl: 3   tadalafil  (CIALIS ) 20 MG tablet, SMARTSIG:0.5-1 Tablet(s) By Mouth Every Other Day PRN, Disp: 10 tablet, Rfl: 2   amLODipine (NORVASC) 5 MG tablet, Take 5 mg by mouth. (Patient not taking: Reported on 10/26/2023), Disp: , Rfl:   Social History: Social History   Tobacco Use   Smoking status: Never   Smokeless tobacco: Never  Vaping Use   Vaping status: Never Used  Substance Use Topics   Alcohol use: Yes   Drug use: No    Family Medical History: Family History  Problem Relation Age of Onset   Alzheimer's disease Father     Physical Examination: Vitals:   10/26/23 1053  BP: (!) 152/84    General: Patient is in no apparent  distress. Attention to examination is appropriate.  Neck:   Supple.  Full range of motion.  Respiratory: Patient is breathing without any difficulty.   NEUROLOGICAL:     Awake, alert, oriented to person, place, and time.  Speech is clear and fluent.   Cranial Nerves: Pupils equal round and reactive to light.  Facial tone is symmetric.  Facial sensation is symmetric. Shoulder shrug is symmetric. Tongue protrusion is midline.  There is no pronator drift.  Strength: Side Biceps Triceps Deltoid Interossei Grip Wrist Ext. Wrist Flex.  R 4 4 4  4- 4- 4+ 4+  L 5 5 5 5 5 5 5    Side Iliopsoas Quads Hamstring PF DF EHL  R 5 5 5 5 5 5   L 5 5 5 5 5 5    Reflexes are 1+ and symmetric at the biceps, triceps, brachioradialis, patella and achilles.   Hoffman's is absent.   Bilateral upper and lower extremity sensation is intact to light touch.  No evidence of dysmetria noted.  Gait is normal.    Grip strength 55 pounds in the right arm and 97 pounds in the left arm.  Medical Decision Making  Imaging: MRI C spine 10/11/2023 IMPRESSION: 1. Multilevel cervical spondylosis without significant spinal stenosis. 2. Multifactorial degenerative changes with resultant moderate to severe bilateral C4 through C8 foraminal narrowing as detailed above. 3. Postoperative changes from prior left-sided micro discectomy at C7-T1.     Electronically Signed   By: Morene Hoard M.D.   On: 10/16/2023 01:52   Xrays of the cervical spine show 4.6 mm of anterior translation of C4 and C5 during flexion that reduces to 2-1/2 mm on extension.  He also has reversal of the normal cervical lordosis.  I have personally reviewed the images and agree with the above interpretation.  Assessment and Plan: Drew Lopez is a pleasant 63 y.o. male with multilevel cervical radiculopathies with objective weakness in his right arm.  He is right-hand dominant and has a substantial loss of strength in his right hand.     He has cervical kyphosis in addition to radiculopathy and weakness due to multilevel cervical stenosis.  Given the level of weakness he has in his right arm, conservative management is not indicated.  I have recommended surgical intervention.  We discussed the various strategies.  To address his kyphosis and stenosis, I have recommended C4-7 anterior cervical discectomy and fusion.  I am concerned that C7-T1 will not be reachable, so we will plan for right sided C7-T1 foraminotomies at the same time.  I discussed the planned procedure at length with the patient, including the risks, benefits, alternatives, and indications. The risks discussed include but are not limited to bleeding, infection, need for reoperation, spinal fluid leak, stroke, vision loss, anesthetic complication, coma, paralysis, and even death. We also discussed the possibility of post-operative dysphagia, vocal cord paralysis, and the risk of adjacent segment disease in the future. I also described in detail that improvement was not guaranteed.  The patient expressed understanding of these risks, and asked that we proceed with surgery. I described the surgery in layman's terms, and gave ample opportunity for questions, which were answered to the best of my ability.  I spent a total of 30 minutes in this patient's care today. This time was spent reviewing pertinent records including imaging studies, obtaining and confirming history, performing a directed evaluation, formulating and discussing my recommendations, and documenting the visit within the medical record.      Thank you for involving me in the care of this patient.      Lasaro Primm K. Clois MD, [facility] Neurosurgery

## 2023-10-26 ENCOUNTER — Encounter: Payer: Self-pay | Admitting: Neurosurgery

## 2023-10-26 ENCOUNTER — Other Ambulatory Visit: Payer: Self-pay

## 2023-10-26 ENCOUNTER — Ambulatory Visit: Admitting: Neurosurgery

## 2023-10-26 VITALS — BP 152/84 | Ht 72.0 in | Wt 240.0 lb

## 2023-10-26 DIAGNOSIS — M4802 Spinal stenosis, cervical region: Secondary | ICD-10-CM

## 2023-10-26 DIAGNOSIS — R29898 Other symptoms and signs involving the musculoskeletal system: Secondary | ICD-10-CM

## 2023-10-26 DIAGNOSIS — M4312 Spondylolisthesis, cervical region: Secondary | ICD-10-CM | POA: Diagnosis not present

## 2023-10-26 DIAGNOSIS — M5412 Radiculopathy, cervical region: Secondary | ICD-10-CM | POA: Diagnosis not present

## 2023-10-26 DIAGNOSIS — Z01818 Encounter for other preprocedural examination: Secondary | ICD-10-CM

## 2023-10-26 DIAGNOSIS — M40292 Other kyphosis, cervical region: Secondary | ICD-10-CM

## 2023-10-26 NOTE — Patient Instructions (Addendum)
 Please see below for information in regards to your upcoming surgery:   Planned surgery: C4-7 anterior cervical discectomy and fusion,  right C7-T1 laminoforaminotomy   Surgery date: 11/24/23 at [facility] (Medical Mall: [address], [place], [place] [address]) - you will find out your arrival time the business day before your surgery.   Pre-op appointment at [facility] Pre-admit Testing: you will receive a call with a date/time for this appointment. If you are scheduled for an in person appointment, Pre-admit Testing is located on the first floor of the [facility] building, [address], [address]. During this appointment, they will advise you which medications you can take the morning of surgery, and which medications you will need to hold for surgery. Labs (such as blood work, EKG) may be done at your pre-op appointment. You are not required to fast for these labs. Should you need to change your pre-op appointment, please call Pre-admit testing at [phone].       Brace: You will need to bring the brace to the hospital on the day of surgery. [facility] will contact you regarding an appointment for the brace you will use after surgery. If it is getting close to your surgery date and you have not received an appointment with [facility], please reach out to us .  Their number is [phone] (for the [place] location) should you miss their call or have an issue with your brace after surgery.     NSAIDS (Non-steroidal anti-inflammatory drugs): because you are having a fusion, please avoid taking any NSAIDS (examples: ibuprofen, motrin, aleve, naproxen, meloxicam, diclofenac) for 3 months after surgery. Celebrex is an exception and is OK to take, if prescribed. Tylenol  is not an NSAID.    Common restrictions after spine surgery: No bending, lifting, or twisting ("BLT"). Avoid lifting objects heavier than 10 pounds for the first 6 weeks after  surgery. Where possible, avoid household activities that involve lifting, bending, reaching, pushing, or pulling such as laundry, vacuuming, grocery shopping, and childcare. Try to arrange for help from friends and family for these activities while you heal. Do not drive while taking prescription pain medication. Weeks 6 through 12 after surgery: avoid lifting more than 25 pounds.    X-rays after surgery: Because you are having a fusion or arthroplasty: for appointments after your 2 week follow-up: please arrive at the [facility] outpatient imaging center ([address], [address], [place]) or [facility] one hour prior to your appointment for x-rays. This applies to every appointment after your 2 week follow-up. Failure to do so may result in your appointment being rescheduled. *We recently started construction to have x-ray in our office. This may be completed by the time you come in for your 6 week post-op appointment. Please check with us  closer to that time to see if you can have your x-rays at our office*    How to contact us :  If you have any questions/concerns before or after surgery, you can reach us  at [phone], or you can send a mychart message. We can be reached by phone or mychart 8am-4pm, Monday-Friday.  *Please note: Calls after 4pm are forwarded to a third party answering service. Mychart messages are not routinely monitored during evenings, weekends, and holidays. Please call our office to contact the answering service for urgent concerns during non-business hours.   If you have FMLA/disability paperwork, please drop it off or fax it to [phone]   Appointments/FMLA & disability  paperwork: [employer] Registered Nurse/Surgery scheduler: Mare Ludtke, RN Certified Medical Assistants: Don, CMA, Elenor, CMA, & Damien, CMA Physician Assistants: Lyle Decamp, PA-C, Edsel Goods, PA-C & Glade Boys, PA-C Surgeons: Penne Sharps, MD & Reeves Daisy, MD   [facility] PREADMIT TESTING VISIT and SURGERY INFORMATION SHEET   Now that surgery has been scheduled you can anticipate several phone calls from [facility] services. A pharmacy technician will call you to verify your current list of medications taken at home.               The Pre-Service Center will call to verify your insurance information and to give you billing estimates and information.             The Preadmit Testing Office will be calling to schedule a visit to obtain information for the anesthesia team and provide instructions on preparation for surgery.  What can you expect for the Preadmit Testing Visit: Appointments may be scheduled in-person or by telephone.  If a telephone visit is scheduled, you may be asked to come into the office to have lab tests or other studies performed.   This visit will not be completed any greater than 14 days prior to your surgery.  If your surgery has been scheduled for a future date, please do not be alarmed if we have not contacted you to schedule an appointment more than a month prior to the surgery date.    Please be prepared to provide the following information during this appointment:            -Personal medical history                                               -Medication and allergy list            -Any history of problems with anesthesia              -Recent lab work or diagnostic studies            -Please notify us  of any needs we should be aware of to provide the best care possible           -You will be provided with instructions on how to prepare for your surgery.    On The Day of Surgery:  You must have a driver to take you home after surgery, you will be asked not to drive for 24 hours following surgery.  Taxi, Gisele and non-medical transport will not be acceptable means of transportation unless you have a responsible individual who will be traveling with you.  Visitors in the surgical  area:   2 people will be able to visit you in your room once your preparation for surgery has been completed. During surgery, your visitors will be asked to wait in the Surgery Waiting Area.  It is not a requirement for them to stay, if they prefer to leave and come back.  Your visitor(s) will be given an update once the surgery has been completed.  No visitors are allowed in the initial recovery room to respect patient privacy and safety.  Once you are more awake and transfer to the secondary recovery area, or are transferred to an inpatient room, visitors will again be able to see you.  To respect and protect your privacy: We will ask  on the day of surgery who your driver will be and what the contact number for that individual will be. We will ask if it is okay to share information with this individual, or if there is an alternative individual that we, or the surgeon, should contact to provide updates and information. If family or friends come to the surgical information desk requesting information about you, who you have not listed with us , no information will be given.   It may be helpful to designate someone as the main contact who will be responsible for updating your other friends and family.    PREADMIT TESTING OFFICE: [phone] SAME DAY SURGERY: [phone] We look forward to caring for you before and throughout the process of your surgery.

## 2023-11-16 ENCOUNTER — Telehealth: Payer: Self-pay | Admitting: Neurosurgery

## 2023-11-16 NOTE — Telephone Encounter (Signed)
 Left message to return call

## 2023-11-16 NOTE — Telephone Encounter (Signed)
 Patient is calling make sure that everything is in place for his upcoming surgery. Patient is aware that his surgery was approved by [facility].

## 2023-11-17 ENCOUNTER — Encounter: Payer: Self-pay | Admitting: Neurosurgery

## 2023-11-17 ENCOUNTER — Other Ambulatory Visit: Payer: Self-pay

## 2023-11-17 ENCOUNTER — Encounter
Admission: RE | Admit: 2023-11-17 | Discharge: 2023-11-17 | Disposition: A | Discharge status: Home / Self Care | Source: Ambulatory Visit | Attending: Neurosurgery | Admitting: Neurosurgery

## 2023-11-17 VITALS — BP 141/90 | HR 67 | Resp 16 | Wt 243.8 lb

## 2023-11-17 DIAGNOSIS — Z01812 Encounter for preprocedural laboratory examination: Secondary | ICD-10-CM

## 2023-11-17 DIAGNOSIS — R29898 Other symptoms and signs involving the musculoskeletal system: Secondary | ICD-10-CM

## 2023-11-17 DIAGNOSIS — Z01818 Encounter for other preprocedural examination: Secondary | ICD-10-CM | POA: Diagnosis present

## 2023-11-17 DIAGNOSIS — J4520 Mild intermittent asthma, uncomplicated: Secondary | ICD-10-CM | POA: Diagnosis not present

## 2023-11-17 DIAGNOSIS — Z01810 Encounter for preprocedural cardiovascular examination: Secondary | ICD-10-CM

## 2023-11-17 HISTORY — DX: Other symptoms and signs involving the musculoskeletal system: R29.898

## 2023-11-17 HISTORY — DX: Headache, unspecified: R51.9

## 2023-11-17 HISTORY — DX: Spondylosis without myelopathy or radiculopathy, cervical region: M47.812

## 2023-11-17 HISTORY — DX: Spinal stenosis, cervical region: M48.02

## 2023-11-17 HISTORY — DX: Radiculopathy, cervical region: M54.12

## 2023-11-17 LAB — CBC
HCT: 40.3 % (ref 39.0–52.0)
Hemoglobin: 13.4 g/dL (ref 13.0–17.0)
MCH: 29.1 pg (ref 26.0–34.0)
MCHC: 33.3 g/dL (ref 30.0–36.0)
MCV: 87.4 fL (ref 80.0–100.0)
Platelets: 250 K/uL (ref 150–400)
RBC: 4.61 MIL/uL (ref 4.22–5.81)
RDW: 13.1 % (ref 11.5–15.5)
WBC: 6.9 K/uL (ref 4.0–10.5)
nRBC: 0 % (ref 0.0–0.2)

## 2023-11-17 LAB — TYPE AND SCREEN
ABO/RH(D): O POS
Antibody Screen: NEGATIVE

## 2023-11-17 LAB — BASIC METABOLIC PANEL WITH GFR
Anion gap: 11 (ref 5–15)
BUN: 13 mg/dL (ref 8–23)
CO2: 23 mmol/L (ref 22–32)
Calcium: 8.9 mg/dL (ref 8.9–10.3)
Chloride: 107 mmol/L (ref 98–111)
Creatinine, Ser: 0.89 mg/dL (ref 0.61–1.24)
GFR, Estimated: >60 mL/min (ref >60)
Glucose, Bld: 103 mg/dL — ABNORMAL HIGH (ref 70–99)
Potassium: 3.8 mmol/L (ref 3.5–5.1)
Sodium: 141 mmol/L (ref 135–145)

## 2023-11-17 LAB — SURGICAL PCR SCREEN
MRSA, PCR: NEGATIVE
Staphylococcus aureus: POSITIVE — AB

## 2023-11-17 NOTE — Patient Instructions (Addendum)
 Your procedure is scheduled on: 11/24/23 - Wednesday Report to the Registration Desk on the 1st floor of the [facility]. To find out your arrival time, please call [phone] between 1PM - 3PM on: 11/23/23 - Tuesday If your arrival time is 6:00 am, do not arrive before that time as the [facility] entrance doors do not open until 6:00 am.  REMEMBER: Instructions that are not followed completely may result in serious medical risk, up to and including death; or upon the discretion of your surgeon and anesthesiologist your surgery may need to be rescheduled.  Do not eat food after midnight the night before surgery.  No gum chewing or hard candies.  You may however, drink CLEAR liquids up to 2 hours before you are scheduled to arrive for your surgery. Do not drink anything within 2 hours of your scheduled arrival time.  Clear liquids include: - water   - apple juice without pulp - gatorade (not RED colors) - black coffee or tea (Do NOT add milk or creamers to the coffee or tea) Do NOT drink anything that is not on this list.  NSAIDS (Non-steroidal anti-inflammatory drugs): because you are having a fusion, please avoid taking any NSAIDS (examples: ibuprofen, motrin, aleve, naproxen, meloxicam, diclofenac) for 3 months after surgery. Celebrex is an exception and is OK to take, if prescribed. Tylenol  is not an NSAID.   Stop ANY OVER THE COUNTER supplements until after surgery : Multiple Vitamin :   tadalafil  (CIALIS ) - hold 2 days prior to surgery.  Bring your CPAP with on the day of surgery.   ON THE DAY OF SURGERY ONLY TAKE THESE MEDICATIONS WITH SIPS OF WATER :  citalopram  (CELEXA )  finasteride  (PROSCAR )  omeprazole  (PRILOSEC)    No Alcohol for 24 hours before or after surgery.  No Smoking including e-cigarettes for 24 hours before surgery.  No chewable tobacco products for at least 6 hours before surgery.  No nicotine patches on the day of surgery.  Do not use any  recreational drugs for at least a week (preferably 2 weeks) before your surgery.  Please be advised that the combination of cocaine and anesthesia may have negative outcomes, up to and including death. If you test positive for cocaine, your surgery will be cancelled.  On the morning of surgery brush your teeth with toothpaste and water , you may rinse your mouth with mouthwash if you wish. Do not swallow any toothpaste or mouthwash.  Use CHG Soap or wipes as directed on instruction sheet.  Do not wear jewelry, make-up, hairpins, clips or nail polish.  For welded (permanent) jewelry: bracelets, anklets, waist bands, etc.  Please have this removed prior to surgery.  If it is not removed, there is a chance that hospital personnel will need to cut it off on the day of surgery.  Do not wear lotions, powders, or perfumes.   Do not shave body hair from the neck down 48 hours before surgery.  Contact lenses, hearing aids and dentures may not be worn into surgery.  Do not bring valuables to the hospital. [facility] is not responsible for any missing/lost belongings or valuables.   Notify your doctor if there is any change in your medical condition (cold, fever, infection).  Wear comfortable clothing (specific to your surgery type) to the hospital.  After surgery, you can help prevent lung complications by doing breathing exercises.  Take deep breaths and cough every 1-2 hours. Your doctor may order a device called an Facilities manager to  help you take deep breaths.  When coughing or sneezing, hold a pillow firmly against your incision with both hands. This is called "splinting." Doing this helps protect your incision. It also decreases belly discomfort.  If you are being admitted to the hospital overnight, leave your suitcase in the car. After surgery it may be brought to your room.  In case of increased patient census, it may be necessary for you, the patient, to continue your  postoperative care in the Same Day Surgery department.  If you are being discharged the day of surgery, you will not be allowed to drive home. You will need a responsible individual to drive you home and stay with you for 24 hours after surgery.   If you are taking public transportation, you will need to have a responsible individual with you.  Please call the Pre-admissions Testing Dept. at [phone] if you have any questions about these instructions.  Surgery Visitation Policy:  Patients having surgery or a procedure may have two visitors.  Children under the age of 40 must have an adult with them who is not the patient.  Inpatient Visitation:    Visiting hours are 7 a.m. to 8 p.m. Up to four visitors are allowed at one time in a patient room. The visitors may rotate out with other people during the day.  One visitor age 41 or older may stay with the patient overnight and must be in the room by 8 p.m.   Merchandiser, retail to address health-related social needs:  [email or web address]   Pre-operative 5 CHG Bath Instructions   You can play a key role in reducing the risk of infection after surgery. Your skin needs to be as free of germs as possible. You can reduce the number of germs on your skin by washing with CHG (chlorhexidine  gluconate) soap before surgery. CHG is an antiseptic soap that kills germs and continues to kill germs even after washing.   DO NOT use if you have an allergy to chlorhexidine /CHG or antibacterial soaps. If your skin becomes reddened or irritated, stop using the CHG and notify one of our RNs at [phone].   Please shower with the CHG soap starting 4 days before surgery using the following schedule: 10/04 - 10/07.    Please keep in mind the following:  DO NOT shave, including legs and underarms, starting the day of your first shower.   You may shave your face at any point before/day of surgery.  Place clean sheets on your bed  the day you start using CHG soap. Use a clean washcloth (not used since being washed) for each shower. DO NOT sleep with pets once you start using the CHG.   CHG Shower Instructions:  If you choose to wash your hair and private area, wash first with your normal shampoo/soap.  After you use shampoo/soap, rinse your hair and body thoroughly to remove shampoo/soap residue.  Turn the water  OFF and apply about 3 tablespoons (45 ml) of CHG soap to a CLEAN washcloth.  Apply CHG soap ONLY FROM YOUR NECK DOWN TO YOUR TOES (washing for 3-5 minutes)  DO NOT use CHG soap on face, private areas, open wounds, or sores.  Pay special attention to the area where your surgery is being performed.  If you are having back surgery, having someone wash your back for you may be helpful. Wait 2 minutes after CHG soap is applied, then you may rinse off the CHG soap.  Pat dry with a  clean towel  Put on clean clothes/pajamas   If you choose to wear lotion, please use ONLY the CHG-compatible lotions on the back of this paper.     Additional instructions for the day of surgery: DO NOT APPLY any lotions, deodorants, cologne, or perfumes.   Put on clean/comfortable clothes.  Brush your teeth.  Ask your nurse before applying any prescription medications to the skin.      CHG Compatible Lotions   Aveeno Moisturizing lotion  Cetaphil Moisturizing Cream  Cetaphil Moisturizing Lotion  Clairol Herbal Essence Moisturizing Lotion, Dry Skin  Clairol Herbal Essence Moisturizing Lotion, Extra Dry Skin  Clairol Herbal Essence Moisturizing Lotion, Normal Skin  Curel Age Defying Therapeutic Moisturizing Lotion with Alpha Hydroxy  Curel Extreme Care Body Lotion  Curel Soothing Hands Moisturizing Hand Lotion  Curel Therapeutic Moisturizing Cream, Fragrance-Free  Curel Therapeutic Moisturizing Lotion, Fragrance-Free  Curel Therapeutic Moisturizing Lotion, Original Formula  Eucerin Daily Replenishing Lotion  Eucerin Dry  Skin Therapy Plus Alpha Hydroxy Crme  Eucerin Dry Skin Therapy Plus Alpha Hydroxy Lotion  Eucerin Original Crme  Eucerin Original Lotion  Eucerin Plus Crme Eucerin Plus Lotion  Eucerin TriLipid Replenishing Lotion  Keri Anti-Bacterial Hand Lotion  Keri Deep Conditioning Original Lotion Dry Skin Formula Softly Scented  Keri Deep Conditioning Original Lotion, Fragrance Free Sensitive Skin Formula  Keri Lotion Fast Absorbing Fragrance Free Sensitive Skin Formula  Keri Lotion Fast Absorbing Softly Scented Dry Skin Formula  Keri Original Lotion  Keri Skin Renewal Lotion Keri Silky Smooth Lotion  Keri Silky Smooth Sensitive Skin Lotion  Nivea Body Creamy Conditioning Oil  Nivea Body Extra Enriched Lotion  Nivea Body Original Lotion  Nivea Body Sheer Moisturizing Lotion Nivea Crme  Nivea Skin Firming Lotion  NutraDerm 30 Skin Lotion  NutraDerm Skin Lotion  NutraDerm Therapeutic Skin Cream  NutraDerm Therapeutic Skin Lotion  ProShield Protective Hand Cream  Provon moisturizing lotion  How to Use an Incentive Spirometer  An incentive spirometer is a tool that measures how well you are filling your lungs with each breath. Learning to take long, deep breaths using this tool can help you keep your lungs clear and active. This may help to reverse or lessen your chance of developing breathing (pulmonary) problems, especially infection. You may be asked to use a spirometer: After a surgery. If you have a lung problem or a history of smoking. After a long period of time when you have been unable to move or be active. If the spirometer includes an indicator to show the highest number that you have reached, your health care provider or respiratory therapist will help you set a goal. Keep a log of your progress as told by your health care provider. What are the risks? Breathing too quickly may cause dizziness or cause you to pass out. Take your time so you do not get dizzy or light-headed. If you  are in pain, you may need to take pain medicine before doing incentive spirometry. It is harder to take a deep breath if you are having pain. How to use your incentive spirometer  Sit up on the edge of your bed or on a chair. Hold the incentive spirometer so that it is in an upright position. Before you use the spirometer, breathe out normally. Place the mouthpiece in your mouth. Make sure your lips are closed tightly around it. Breathe in slowly and as deeply as you can through your mouth, causing the piston or the ball to rise toward the top of  the chamber. Hold your breath for 3-5 seconds, or for as long as possible. If the spirometer includes a coach indicator, use this to guide you in breathing. Slow down your breathing if the indicator goes above the marked areas. Remove the mouthpiece from your mouth and breathe out normally. The piston or ball will return to the bottom of the chamber. Rest for a few seconds, then repeat the steps 10 or more times. Take your time and take a few normal breaths between deep breaths so that you do not get dizzy or light-headed. Do this every 1-2 hours when you are awake. If the spirometer includes a goal marker to show the highest number you have reached (best effort), use this as a goal to work toward during each repetition. After each set of 10 deep breaths, cough a few times. This will help to make sure that your lungs are clear. If you have an incision on your chest or abdomen from surgery, place a pillow or a rolled-up towel firmly against the incision when you cough. This can help to reduce pain while taking deep breaths and coughing. General tips When you are able to get out of bed: Walk around often. Continue to take deep breaths and cough in order to clear your lungs. Keep using the incentive spirometer until your health care provider says it is okay to stop using it. If you have been in the hospital, you may be told to keep using the spirometer at  home. Contact a health care provider if: You are having difficulty using the spirometer. You have trouble using the spirometer as often as instructed. Your pain medicine is not giving enough relief for you to use the spirometer as told. You have a fever. Get help right away if: You develop shortness of breath. You develop a cough with bloody mucus from the lungs. You have fluid or blood coming from an incision site after you cough. Summary An incentive spirometer is a tool that can help you learn to take long, deep breaths to keep your lungs clear and active. You may be asked to use a spirometer after a surgery, if you have a lung problem or a history of smoking, or if you have been inactive for a long period of time. Use your incentive spirometer as instructed every 1-2 hours while you are awake. If you have an incision on your chest or abdomen, place a pillow or a rolled-up towel firmly against your incision when you cough. This will help to reduce pain. Get help right away if you have shortness of breath, you cough up bloody mucus, or blood comes from your incision when you cough. This information is not intended to replace advice given to you by your health care provider. Make sure you discuss any questions you have with your health care provider. Document Revised: 04/24/2019 Document Reviewed: 04/24/2019 Elsevier Patient Education  2023 [employer].

## 2023-11-19 NOTE — Telephone Encounter (Signed)
 Reche spoke with the patient and answered his questions. Per Reche, I do not need to call the patient back.

## 2023-11-24 ENCOUNTER — Other Ambulatory Visit: Payer: Self-pay

## 2023-11-24 ENCOUNTER — Observation Stay
Admission: RE | Admit: 2023-11-24 | Discharge: 2023-11-25 | Disposition: A | Discharge status: Home / Self Care | Attending: Neurosurgery | Admitting: Neurosurgery

## 2023-11-24 ENCOUNTER — Ambulatory Visit: Payer: Self-pay | Admitting: Urgent Care

## 2023-11-24 ENCOUNTER — Ambulatory Visit

## 2023-11-24 ENCOUNTER — Encounter: Payer: Self-pay | Admitting: Neurosurgery

## 2023-11-24 ENCOUNTER — Encounter: Admission: RE | Disposition: A | Payer: Self-pay | Source: Home / Self Care | Attending: Neurosurgery

## 2023-11-24 DIAGNOSIS — Z01818 Encounter for other preprocedural examination: Secondary | ICD-10-CM

## 2023-11-24 DIAGNOSIS — Z981 Arthrodesis status: Principal | ICD-10-CM | POA: Insufficient documentation

## 2023-11-24 DIAGNOSIS — M4802 Spinal stenosis, cervical region: Secondary | ICD-10-CM | POA: Diagnosis not present

## 2023-11-24 DIAGNOSIS — R29898 Other symptoms and signs involving the musculoskeletal system: Secondary | ICD-10-CM | POA: Diagnosis not present

## 2023-11-24 DIAGNOSIS — F1090 Alcohol use, unspecified, uncomplicated: Secondary | ICD-10-CM | POA: Insufficient documentation

## 2023-11-24 DIAGNOSIS — J45909 Unspecified asthma, uncomplicated: Secondary | ICD-10-CM | POA: Diagnosis not present

## 2023-11-24 DIAGNOSIS — M5412 Radiculopathy, cervical region: Principal | ICD-10-CM | POA: Insufficient documentation

## 2023-11-24 DIAGNOSIS — M542 Cervicalgia: Secondary | ICD-10-CM | POA: Diagnosis present

## 2023-11-24 HISTORY — PX: FORAMINOTOMY, SPINE, CERVICAL, 1 LEVEL: SHX6970

## 2023-11-24 HISTORY — DX: Failed or difficult intubation, initial encounter: T88.4XXA

## 2023-11-24 HISTORY — DX: Obstructive sleep apnea (adult) (pediatric): G47.33

## 2023-11-24 HISTORY — DX: Basal cell carcinoma of skin, unspecified: C44.91

## 2023-11-24 HISTORY — PX: ANTERIOR CERVICAL DECOMP/DISCECTOMY FUSION: SHX1161

## 2023-11-24 SURGERY — ANTERIOR CERVICAL DECOMPRESSION/DISCECTOMY FUSION 3 LEVELS
Anesthesia: General

## 2023-11-24 MED ORDER — BUPIVACAINE LIPOSOME 1.3 % IJ SUSP
INTRAMUSCULAR | Status: AC
  Filled 2023-11-24: qty 20

## 2023-11-24 MED ORDER — ONDANSETRON HCL 4 MG PO TABS: 4.0000 mg | ORAL_TABLET | Freq: Four times a day (QID) | ORAL | Status: DC | PRN

## 2023-11-24 MED ORDER — ONDANSETRON HCL 4 MG/2ML IJ SOLN
INTRAMUSCULAR | Status: DC | PRN
  Administered 2023-11-24: 4 mg via INTRAVENOUS

## 2023-11-24 MED ORDER — ORAL CARE MOUTH RINSE: 15.0000 mL | Freq: Once | OROMUCOSAL | Status: AC

## 2023-11-24 MED ORDER — HYDROMORPHONE HCL 1 MG/ML IJ SOLN
INTRAMUSCULAR | Status: AC
  Filled 2023-11-24: qty 1

## 2023-11-24 MED ORDER — PROPOFOL 10 MG/ML IV BOLUS
INTRAVENOUS | Status: AC
  Filled 2023-11-24: qty 20

## 2023-11-24 MED ORDER — NALOXONE HCL 0.4 MG/ML IJ SOLN
INTRAMUSCULAR | Status: AC
  Filled 2023-11-24: qty 1

## 2023-11-24 MED ORDER — SODIUM CHLORIDE (PF) 0.9 % IJ SOLN
INTRAMUSCULAR | Status: DC | PRN
  Administered 2023-11-24: 60 mL

## 2023-11-24 MED ORDER — PROPOFOL 500 MG/50ML IV EMUL
INTRAVENOUS | Status: DC | PRN
  Administered 2023-11-24: 140 ug/kg/min via INTRAVENOUS

## 2023-11-24 MED ORDER — ACETAMINOPHEN 325 MG PO TABS: 650.0000 mg | ORAL_TABLET | ORAL | Status: DC | PRN

## 2023-11-24 MED ORDER — NALOXONE HCL 0.4 MG/ML IJ SOLN
INTRAMUSCULAR | Status: DC | PRN
Start: 2023-11-24 — End: 2023-11-24
  Administered 2023-11-24: 20 ug via INTRAVENOUS
  Administered 2023-11-24 (×2): 40 ug via INTRAVENOUS
  Administered 2023-11-24: 20 ug via INTRAVENOUS

## 2023-11-24 MED ORDER — SODIUM CHLORIDE 0.9% FLUSH: 3.0000 mL | INTRAVENOUS | Status: DC | PRN

## 2023-11-24 MED ORDER — DEXAMETHASONE SODIUM PHOSPHATE 10 MG/ML IJ SOLN
INTRAMUSCULAR | Status: DC | PRN
  Administered 2023-11-24: 10 mg via INTRAVENOUS

## 2023-11-24 MED ORDER — OXYCODONE HCL 5 MG PO TABS
10.0000 mg | ORAL_TABLET | ORAL | Status: DC | PRN
  Administered 2023-11-24 – 2023-11-25 (×2): 10 mg via ORAL
  Filled 2023-11-24 (×2): qty 2

## 2023-11-24 MED ORDER — PHENOL 1.4 % MT LIQD: 1.0000 | OROMUCOSAL | Status: DC | PRN

## 2023-11-24 MED ORDER — CEFAZOLIN SODIUM-DEXTROSE 2-4 GM/100ML-% IV SOLN
INTRAVENOUS | Status: AC
  Filled 2023-11-24: qty 100

## 2023-11-24 MED ORDER — BUPIVACAINE-EPINEPHRINE (PF) 0.5% -1:200000 IJ SOLN
INTRAMUSCULAR | Status: DC | PRN
  Administered 2023-11-24 (×2): 10 mL

## 2023-11-24 MED ORDER — ONDANSETRON HCL 4 MG/2ML IJ SOLN: 4.0000 mg | Freq: Four times a day (QID) | INTRAMUSCULAR | Status: DC | PRN

## 2023-11-24 MED ORDER — METHOCARBAMOL 500 MG PO TABS
500.0000 mg | ORAL_TABLET | Freq: Four times a day (QID) | ORAL | Status: DC | PRN
  Administered 2023-11-24: 500 mg via ORAL
  Filled 2023-11-24: qty 1

## 2023-11-24 MED ORDER — PHENYLEPHRINE 80 MCG/ML (10ML) SYRINGE FOR IV PUSH (FOR BLOOD PRESSURE SUPPORT)
PREFILLED_SYRINGE | INTRAVENOUS | Status: AC
  Filled 2023-11-24: qty 10

## 2023-11-24 MED ORDER — PROPOFOL 1000 MG/100ML IV EMUL
INTRAVENOUS | Status: AC
  Filled 2023-11-24: qty 100

## 2023-11-24 MED ORDER — PROPOFOL 10 MG/ML IV BOLUS
INTRAVENOUS | Status: DC | PRN
  Administered 2023-11-24: 160 mg via INTRAVENOUS
  Administered 2023-11-24: 40 mg via INTRAVENOUS

## 2023-11-24 MED ORDER — ONDANSETRON HCL 4 MG/2ML IJ SOLN
INTRAMUSCULAR | Status: AC
  Filled 2023-11-24: qty 2

## 2023-11-24 MED ORDER — FLUTICASONE PROPIONATE 50 MCG/ACT NA SUSP: 1.0000 | Freq: Every day | NASAL | Status: DC | PRN

## 2023-11-24 MED ORDER — SURGIFLO WITH THROMBIN (HEMOSTATIC MATRIX KIT) OPTIME
TOPICAL | Status: DC | PRN
  Administered 2023-11-24 (×2): 1

## 2023-11-24 MED ORDER — LACTATED RINGERS IV SOLN: INTRAVENOUS | Status: DC

## 2023-11-24 MED ORDER — ACETAMINOPHEN 650 MG RE SUPP: 650.0000 mg | RECTAL | Status: DC | PRN

## 2023-11-24 MED ORDER — CHLORHEXIDINE GLUCONATE 4 % EX SOLN
1.0000 | CUTANEOUS | 1 refills | Status: DC
  Filled 2023-11-24: qty 236, 15d supply, fill #0

## 2023-11-24 MED ORDER — 0.9 % SODIUM CHLORIDE (POUR BTL) OPTIME
TOPICAL | Status: DC | PRN
Start: 2023-11-24 — End: 2023-11-24
  Administered 2023-11-24: 350 mL

## 2023-11-24 MED ORDER — DEXAMETHASONE SODIUM PHOSPHATE 10 MG/ML IJ SOLN
INTRAMUSCULAR | Status: AC
  Filled 2023-11-24: qty 1

## 2023-11-24 MED ORDER — MUPIROCIN 2 % EX OINT
1.0000 | TOPICAL_OINTMENT | Freq: Two times a day (BID) | CUTANEOUS | 0 refills | Status: DC
  Filled 2023-11-24: qty 22, 30d supply, fill #0

## 2023-11-24 MED ORDER — HYDROMORPHONE HCL 1 MG/ML IJ SOLN: 0.5000 mg | INTRAMUSCULAR | Status: AC | PRN

## 2023-11-24 MED ORDER — SORBITOL 70 % SOLN: 30.0000 mL | Freq: Every day | Status: DC | PRN

## 2023-11-24 MED ORDER — LORATADINE 10 MG PO TABS
10.0000 mg | ORAL_TABLET | Freq: Every day | ORAL | Status: DC
  Administered 2023-11-25: 10 mg via ORAL
  Filled 2023-11-24: qty 1

## 2023-11-24 MED ORDER — SUCCINYLCHOLINE CHLORIDE 200 MG/10ML IV SOSY
PREFILLED_SYRINGE | INTRAVENOUS | Status: DC | PRN
  Administered 2023-11-24: 100 mg via INTRAVENOUS

## 2023-11-24 MED ORDER — MIDAZOLAM HCL 2 MG/2ML IJ SOLN
INTRAMUSCULAR | Status: DC | PRN
  Administered 2023-11-24: 2 mg via INTRAVENOUS

## 2023-11-24 MED ORDER — OXYCODONE HCL 5 MG PO TABS: 5.0000 mg | ORAL_TABLET | Freq: Once | ORAL | Status: DC | PRN

## 2023-11-24 MED ORDER — CITALOPRAM HYDROBROMIDE 20 MG PO TABS
20.0000 mg | ORAL_TABLET | Freq: Every day | ORAL | Status: DC
  Administered 2023-11-25: 20 mg via ORAL
  Filled 2023-11-24: qty 1

## 2023-11-24 MED ORDER — FENTANYL CITRATE (PF) 100 MCG/2ML IJ SOLN
INTRAMUSCULAR | Status: DC | PRN
  Administered 2023-11-24 (×2): 50 ug via INTRAVENOUS

## 2023-11-24 MED ORDER — HYDROMORPHONE HCL 1 MG/ML IJ SOLN
INTRAMUSCULAR | Status: DC | PRN
  Administered 2023-11-24 (×2): 1 mg via INTRAVENOUS

## 2023-11-24 MED ORDER — MIDAZOLAM HCL 2 MG/2ML IJ SOLN
INTRAMUSCULAR | Status: AC
  Filled 2023-11-24: qty 2

## 2023-11-24 MED ORDER — OXYCODONE HCL 5 MG PO TABS
5.0000 mg | ORAL_TABLET | ORAL | Status: DC | PRN
  Administered 2023-11-25: 5 mg via ORAL
  Filled 2023-11-24: qty 1

## 2023-11-24 MED ORDER — CEFAZOLIN IN SODIUM CHLORIDE 2-0.9 GM/100ML-% IV SOLN
2.0000 g | Freq: Once | INTRAVENOUS | Status: AC
  Administered 2023-11-24: 2 g via INTRAVENOUS

## 2023-11-24 MED ORDER — POLYETHYLENE GLYCOL 3350 17 G PO PACK: 17.0000 g | PACK | Freq: Every day | ORAL | Status: DC | PRN

## 2023-11-24 MED ORDER — MENTHOL 3 MG MT LOZG: 1.0000 | LOZENGE | OROMUCOSAL | Status: DC | PRN

## 2023-11-24 MED ORDER — MAGNESIUM CITRATE PO SOLN: 1.0000 | Freq: Once | ORAL | Status: DC | PRN

## 2023-11-24 MED ORDER — ENOXAPARIN SODIUM 60 MG/0.6ML IJ SOSY
55.0000 mg | PREFILLED_SYRINGE | INTRAMUSCULAR | Status: DC
  Administered 2023-11-25: 55 mg via SUBCUTANEOUS
  Filled 2023-11-24: qty 0.6

## 2023-11-24 MED ORDER — LIDOCAINE HCL (CARDIAC) PF 100 MG/5ML IV SOSY
PREFILLED_SYRINGE | INTRAVENOUS | Status: DC | PRN
  Administered 2023-11-24: 100 mg via INTRAVENOUS

## 2023-11-24 MED ORDER — DOCUSATE SODIUM 100 MG PO CAPS
100.0000 mg | ORAL_CAPSULE | Freq: Two times a day (BID) | ORAL | Status: DC
  Administered 2023-11-24 – 2023-11-25 (×2): 100 mg via ORAL
  Filled 2023-11-24 (×2): qty 1

## 2023-11-24 MED ORDER — OXYCODONE HCL 5 MG/5ML PO SOLN: 5.0000 mg | Freq: Once | ORAL | Status: DC | PRN

## 2023-11-24 MED ORDER — LIDOCAINE HCL (PF) 2 % IJ SOLN
INTRAMUSCULAR | Status: AC
  Filled 2023-11-24: qty 5

## 2023-11-24 MED ORDER — CHLORHEXIDINE GLUCONATE 0.12 % MT SOLN
OROMUCOSAL | Status: AC
Start: 2023-11-24 — End: 2023-11-24
  Filled 2023-11-24: qty 15

## 2023-11-24 MED ORDER — FINASTERIDE 5 MG PO TABS
5.0000 mg | ORAL_TABLET | Freq: Every day | ORAL | Status: DC
  Administered 2023-11-25: 5 mg via ORAL
  Filled 2023-11-24: qty 1

## 2023-11-24 MED ORDER — ACETAMINOPHEN 500 MG PO TABS
1000.0000 mg | ORAL_TABLET | Freq: Four times a day (QID) | ORAL | Status: DC
  Administered 2023-11-25 (×3): 1000 mg via ORAL
  Filled 2023-11-24 (×3): qty 2

## 2023-11-24 MED ORDER — METHOCARBAMOL 1000 MG/10ML IJ SOLN: 500.0000 mg | Freq: Four times a day (QID) | INTRAMUSCULAR | Status: DC | PRN

## 2023-11-24 MED ORDER — PHENYLEPHRINE 80 MCG/ML (10ML) SYRINGE FOR IV PUSH (FOR BLOOD PRESSURE SUPPORT)
PREFILLED_SYRINGE | INTRAVENOUS | Status: DC | PRN
  Administered 2023-11-24: 80 ug via INTRAVENOUS
  Administered 2023-11-24: 160 ug via INTRAVENOUS
  Administered 2023-11-24: 80 ug via INTRAVENOUS

## 2023-11-24 MED ORDER — SODIUM CHLORIDE 0.9 % IV SOLN: 250.0000 mL | INTRAVENOUS | Status: DC

## 2023-11-24 MED ORDER — SODIUM CHLORIDE 0.9% FLUSH
3.0000 mL | Freq: Two times a day (BID) | INTRAVENOUS | Status: DC
  Administered 2023-11-24: 3 mL via INTRAVENOUS

## 2023-11-24 MED ORDER — PANTOPRAZOLE SODIUM 40 MG PO TBEC
40.0000 mg | DELAYED_RELEASE_TABLET | Freq: Every day | ORAL | Status: DC
  Administered 2023-11-25: 40 mg via ORAL
  Filled 2023-11-24: qty 1

## 2023-11-24 MED ORDER — CHLORHEXIDINE GLUCONATE 0.12 % MT SOLN
15.0000 mL | Freq: Once | OROMUCOSAL | Status: AC
  Administered 2023-11-24: 15 mL via OROMUCOSAL

## 2023-11-24 MED ORDER — FENTANYL CITRATE (PF) 100 MCG/2ML IJ SOLN: 25.0000 ug | INTRAMUSCULAR | Status: DC | PRN

## 2023-11-24 MED ORDER — SUCCINYLCHOLINE CHLORIDE 200 MG/10ML IV SOSY
PREFILLED_SYRINGE | INTRAVENOUS | Status: AC
Start: 2023-11-24 — End: 2023-11-24
  Filled 2023-11-24: qty 10

## 2023-11-24 MED ORDER — FENTANYL CITRATE (PF) 100 MCG/2ML IJ SOLN
INTRAMUSCULAR | Status: AC
  Filled 2023-11-24: qty 2

## 2023-11-24 MED ORDER — SENNA 8.6 MG PO TABS
1.0000 | ORAL_TABLET | Freq: Two times a day (BID) | ORAL | Status: DC
  Administered 2023-11-24 – 2023-11-25 (×2): 8.6 mg via ORAL
  Filled 2023-11-24 (×2): qty 1

## 2023-11-24 SURGICAL SUPPLY — 53 items
BASIN KIT SINGLE STR (MISCELLANEOUS) ×2 IMPLANT
BUR NEURO DRILL SOFT 3.0X3.8M (BURR) ×2 IMPLANT
CORD BIP STRL DISP 12FT (MISCELLANEOUS) IMPLANT
DERMABOND ADVANCED .7 DNX12 (GAUZE/BANDAGES/DRESSINGS) ×1 IMPLANT
DRAIN CHANNEL JP 10F RND 20C F (MISCELLANEOUS) IMPLANT
DRAPE C ARM PK CFD 31 SPINE (DRAPES) ×1 IMPLANT
DRAPE HD 5FT BACK TABLE (DRAPES) ×1 IMPLANT
DRAPE LAPAROTOMY 100X77 ABD (DRAPES) ×1 IMPLANT
DRAPE LAPAROTOMY 77X122 PED (DRAPES) ×1 IMPLANT
DRAPE TABLE BACK 80X90 (DRAPES) ×1 IMPLANT
DRSG TEGADERM 4X4.75 (GAUZE/BANDAGES/DRESSINGS) IMPLANT
ELECTRODE REM PT RTRN 9FT ADLT (ELECTROSURGICAL) ×2 IMPLANT
EVACUATOR 1/8 PVC DRAIN (DRAIN) IMPLANT
EVACUATOR SILICONE 100CC (DRAIN) IMPLANT
GAUZE SPONGE 2X2 STRL 8-PLY (GAUZE/BANDAGES/DRESSINGS) IMPLANT
GLOVE BIOGEL PI IND STRL 6.5 (GLOVE) ×2 IMPLANT
GLOVE SURG SYN 6.5 PF PI (GLOVE) ×2 IMPLANT
GLOVE SURG SYN 8.5 PF PI (GLOVE) ×6 IMPLANT
GOWN SRG LRG LVL 4 IMPRV REINF (GOWNS) ×2 IMPLANT
GOWN SRG XL LVL 3 NONREINFORCE (GOWNS) ×2 IMPLANT
HOLDER FOLEY CATH W/STRAP (MISCELLANEOUS) IMPLANT
KIT PREVENA INCISION MGT 13 (CANNISTER) IMPLANT
KIT SPINAL PRONEVIEW (KITS) ×1 IMPLANT
KIT TURNOVER KIT A (KITS) ×1 IMPLANT
LAVAGE JET IRRISEPT WOUND (IRRIGATION / IRRIGATOR) IMPLANT
MANIFOLD NEPTUNE II (INSTRUMENTS) ×2 IMPLANT
MARKER SKIN DUAL TIP RULER LAB (MISCELLANEOUS) IMPLANT
NDL SAFETY ECLIPSE 18X1.5 (NEEDLE) ×1 IMPLANT
NS IRRIG 500ML POUR BTL (IV SOLUTION) ×1 IMPLANT
PACK LAMINECTOMY ARMC (PACKS) ×2 IMPLANT
PAD ARMBOARD POSITIONER FOAM (MISCELLANEOUS) ×3 IMPLANT
PENCIL SMOKE EVACUATOR (MISCELLANEOUS) IMPLANT
PIN CASPAR 14 (PIN) ×1 IMPLANT
PLATE ACP 1.9X56 3LVL (Screw) IMPLANT
SCREW ACP 3.5X17 S/D VARIA (Screw) IMPLANT
SCREW ACP VA ST 4.0 X 17 (Screw) IMPLANT
SOLN 0.9% NACL 500 ML (IV SOLUTION) ×1 IMPLANT
SOLN 0.9% NACL POUR BTL 500 ML (IV SOLUTION) ×1 IMPLANT
SPACER CERVICAL FRGE 12X14X7-7 (Spacer) IMPLANT
SPONGE KITTNER 5P (MISCELLANEOUS) ×1 IMPLANT
STAPLER SKIN PROX 35W (STAPLE) ×1 IMPLANT
SURGIFLO W/THROMBIN 8M KIT (HEMOSTASIS) ×2 IMPLANT
SUT STRATA 3-0 15 PS-2 (SUTURE) ×2 IMPLANT
SUT VIC AB 0 CT1 27XCR 8 STRN (SUTURE) ×2 IMPLANT
SUT VIC AB 2-0 CT1 18 (SUTURE) ×2 IMPLANT
SUT VIC AB 3-0 SH 8-18 (SUTURE) ×1 IMPLANT
SUTURE EHLN 3-0 FS-10 30 BLK (SUTURE) IMPLANT
SYR 20ML LL LF (SYRINGE) ×1 IMPLANT
SYR 30ML LL (SYRINGE) ×2 IMPLANT
TAPE CLOTH 3X10 WHT NS LF (GAUZE/BANDAGES/DRESSINGS) ×4 IMPLANT
TRAP FLUID SMOKE EVACUATOR (MISCELLANEOUS) ×2 IMPLANT
TRAY FOLEY SLVR 16FR LF STAT (SET/KITS/TRAYS/PACK) IMPLANT
TUBING CONNECTING 10 (TUBING) IMPLANT

## 2023-11-24 NOTE — Transfer of Care (Signed)
 Immediate Anesthesia Transfer of Care Note  Patient: Drew Lopez  Procedure(s) Performed: ANTERIOR CERVICAL DECOMPRESSION/DISCECTOMY FUSION 3 LEVELS FORAMINOTOMY, SPINE, CERVICAL, 1 LEVEL  Patient Location: PACU  Anesthesia Type:General  Level of Consciousness: sedated and unresponsive  Airway & Oxygen Therapy: Patient Spontanous Breathing and Patient connected to face mask oxygen  Post-op Assessment: Report given to RN and Post -op Vital signs reviewed and stable  Post vital signs: Reviewed and stable  Last Vitals:  Vitals Value Taken Time  BP 154/86 11/24/23 16:22  Temp 36.4 C 11/24/23 16:22  Pulse 88 11/24/23 16:26  Resp 10 11/24/23 16:26  SpO2 96 % 11/24/23 16:26  Vitals shown include unfiled device data.  Last Pain:  Vitals:   11/24/23 0922  TempSrc: Temporal  PainSc: 2       Patients Stated Pain Goal: 0 (11/24/23 9077)  Complications: No notable events documented.

## 2023-11-24 NOTE — Plan of Care (Signed)
  Problem: Pain Managment: Goal: General experience of comfort will improve and/or be controlled Outcome: Progressing

## 2023-11-24 NOTE — Anesthesia Procedure Notes (Signed)
 Procedure Name: Intubation Date/Time: 11/24/2023 10:44 AM  Performed by: Veronica Alm BROCKS, CRNAPre-anesthesia Checklist: Patient identified, Patient being monitored, Timeout performed, Emergency Drugs available and Suction available Patient Re-evaluated:Patient Re-evaluated prior to induction Oxygen Delivery Method: Circle system utilized Preoxygenation: Pre-oxygenation with 100% oxygen Induction Type: IV induction Ventilation: Mask ventilation without difficulty Laryngoscope Size: McGrath and 4 Grade View: Grade I Tube type: Oral Tube size: 7.5 mm Number of attempts: 1 Airway Equipment and Method: Stylet Placement Confirmation: ETT inserted through vocal cords under direct vision, positive ETCO2 and breath sounds checked- equal and bilateral Secured at: 22 cm Tube secured with: Tape Dental Injury: Teeth and Oropharynx as per pre-operative assessment

## 2023-11-24 NOTE — Op Note (Signed)
 Indications: [name] is a 63 y.o. male with M54.12 Cervical radiculopathy, M48.02 Cervical stenosis of spinal canal, R29.898 Right arm weakness   Due to weakness, surgical intervention was recommended.  Findings: stenosis, successful decompression  Preoperative Diagnosis: M54.12 Cervical radiculopathy, M48.02 Cervical stenosis of spinal canal, R29.898 Right arm weakness  Postoperative Diagnosis: same   EBL: 150 ml IVF: see AR Drains: one anterior and one posterior Disposition: Extubated and Stable to PACU Complications: none  No foley catheter was placed.   Preoperative Note:    Risks of surgery discussed include: infection, bleeding, stroke, coma, death, paralysis, CSF leak, nerve/spinal cord injury, numbness, tingling, weakness, complex regional pain syndrome, recurrent stenosis and/or disc herniation, vascular injury, development of instability, neck/back pain, need for further surgery, persistent symptoms, development of deformity, and the risks of anesthesia. The patient understood these risks and agreed to proceed.     Operative Procedure: 1. Anterior Cervical Discectomy and Fusion C4-5 including bilateral foraminotomies and end plate preparation  2. Anterior Cervical Discectomy and Fusion C5-6 including bilateral foraminotomies and end plate preparation  3. Anterior Cervical Discectomy and Fusion C6-7 including bilateral foraminotomies and end plate preparation 4. Anterior Spinal Instrumentation C4 to 7 5. Anterior arthrodesis from C4 to C7 6. Use of the operative microscope 7. Use of intraoperative flouroscopy 8. Right C7/T1 foraminotomy  PROCEDURE IN DETAIL: After obtaining informed consent, the patient taken to the operating room, placed in supine position, general anesthesia induced.  The patient had a small shoulder roll placed behind their shoulders.  The patient received preop antibiotics and IV Decadron .  The patient had a neck incision outlined, was  prepped and draped in usual sterile fashion. A timeout was performed.  The incision was injected with local anesthetic.   An incision was opened, dissection taken down medial to the carotid artery and jugular vein, lateral to the trachea and esophagus.  The prevertebral fascia identified and a localizing x-ray demonstrated the correct level.  The longus colli were dissected laterally, and self-retaining retractors placed to open the operative field. The microscope was then brought into the field.  With this complete, distractor pins were placed in the vertebral bodies of C4 and C5.  The distractor was placed at C4-5.  The annulus at C4-5 was opened. Curettes and pituitary rongeurs used to remove the majority of disk, then the drill was used to remove the posterior osteophyte, expose the posterior longitudinal ligament, and begin the foraminotomies. The nerve hook was used to elevate the posterior longitudinal ligament, which was then removed with Kerrison rongeurs to complete decompression of the spinal cord. The Kerrison rongeurs were then used to complete the foraminotomies bilaterally to decompress the nerve roots. The nerve hook could be passed out each foramen, ensuring decompression of the nerve roots. Meticulous hemostasis was obtained. Structural allograft was tapped behind the anterior lip of the vertebral body at C4/5 (7 mm).. The device had been filled with demineralized bone matrix for aid in arthrodesis.  Please note that the procedure included removal of the disc, removal of the posterior osteophytes, and removal of the posterior longitudinal ligament to ensure decompression of the spinal cord.  Additionally, foraminotomies were performed on both sides of the spinal canal to decompress the nerve roots.  The caspar distractor was removed. The pin at C4 was removed and bone wax used for hemostasis.  The pin was then repositioned at C7 and the distractor placed from C5-C7.    The annulus of C5-6  was opened.  Curettes and pituitary rongeurs used to remove the majority of disk, then the drill was used to remove the posterior osteophyte, expose the posterior longitudinal ligament, and begin the foraminotomies. The nerve hook was used to elevate the posterior longitudinal ligament, which was then removed with Kerrison rongeurs to complete decompression of the spinal cord. The Kerrison rongeurs were then used to complete the foraminotomies bilaterally to decompress the nerve roots. The nerve hook could be passed out each foramen, ensuring decompression of the nerve roots. Meticulous hemostasis was obtained. Structural allograft was tapped behind the anterior lip of the vertebral body at C5/6 (7 mm). The device had been filled with demineralized bone matrix for aid in arthrodesis.  Please note that the procedure included removal of the disc, removal of the posterior osteophytes, and removal of the posterior longitudinal ligament to ensure decompression of the spinal cord.  Additionally, foraminotomies were performed on both sides of the spinal canal to decompress the nerve roots.  The annulus of C6-7 was opened.  Curettes and pituitary rongeurs used to remove the majority of disk, then the drill was used to remove the posterior osteophyte, expose the posterior longitudinal ligament, and begin the foraminotomies. The nerve hook was used to elevate the posterior longitudinal ligament, which was then removed with Kerrison rongeurs to complete decompression of the spinal cord. The Kerrison rongeurs were then used to complete the foraminotomies bilaterally to decompress the nerve roots. The nerve hook could be passed out each foramen, ensuring decompression of the nerve roots. Meticulous hemostasis was obtained. Structural allograft was tapped behind the anterior lip of the vertebral body at C6/7 (7 mm). The device had been filled with demineralized bone matrix for aid in arthrodesis.  Please note that the  procedure included removal of the disc, removal of the posterior osteophytes, and removal of the posterior longitudinal ligament to ensure decompression of the spinal cord.  Additionally, foraminotomies were performed on both sides of the spinal canal to decompress the nerve roots.  A separate, four segment, three level plate (56 mm Nuvasive ACP) was chosen.  Two screws placed in the vertebral bodies of all four segments, respectively making sure the screws were behind the locking mechanism.  Final AP and lateral radiographs were taken.   Please note that the plate is not inclusive to the interbody structural allograft.  The anchoring mechanism of the plate is completely separate from the allograft.  A drain was placed.  With everything in good position, the wound was irrigated copiously with bacitracin -containing solution and meticulous hemostasis obtained.  Wound was closed in 2 layers using interrupted inverted 3-0 Vicryl sutures in the platysma and 3-0 monocryl in the dermis.  The wound was dressed with dermabond.   We then repositioned in the posterior position.  The prior incision was identified and marked.  The operative site was prepped.  The drapes were placed and timeout confirmed.  The incision was opened.  Dissection was carried down until the right C7 and T1 hemilamina were exposed.  This was confirmed using fluoroscopy.  We then performed right sided C7-T1 laminoforaminotomy including removal of the medial facet until the right C8 nerve root was visualized and confirmed to be decompressed.  Decompression was performed using 1 and 2 mm Kerrison punch.  After decompression, full right sided C7-T1 laminoforaminotomy had been performed.  The operative site was irrigated and hemostasis confirmed.  A drain was placed.  The wound was then closed in layers.   All counts were correct at the  end of the case.   I performed the entire procedure with Edsel Goods PA as an Geophysicist/field seismologist. An assistant  was required for this procedure due to the complexity.  The assistant provided assistance in tissue manipulation and suction, and was required for the successful and safe performance of the procedure. I performed the critical portions of the procedure.   Reeves Daisy MD

## 2023-11-24 NOTE — Anesthesia Preprocedure Evaluation (Signed)
 Anesthesia Evaluation  Patient identified by MRN, date of birth, ID band Patient awake    Reviewed: Allergy & Precautions, NPO status , Patient's Chart, lab work & pertinent test results  History of Anesthesia Complications (+) PONV and history of anesthetic complications  Airway Mallampati: III  TM Distance: >3 FB Neck ROM: full    Dental  (+) Chipped   Pulmonary asthma , sleep apnea and Continuous Positive Airway Pressure Ventilation    Pulmonary exam normal        Cardiovascular (-) angina (-) Past MI and (-) DOE negative cardio ROS Normal cardiovascular exam     Neuro/Psych  PSYCHIATRIC DISORDERS Anxiety Depression    Lumbar radiculopathy  Right leg weakness  S/p C7-T1 laminectomy  Neuromuscular disease    GI/Hepatic Neg liver ROS,GERD  ,,  Endo/Other  negative endocrine ROS    Renal/GU      Musculoskeletal   Abdominal   Peds  Hematology negative hematology ROS (+)   Anesthesia Other Findings Hx of difficult intubation.  Past Medical History: No date: Allergy No date: Anxiety No date: Arthritis     Comment:  BACK-LOWER No date: Cancer (HCC)     Comment:  BASAL CELL No date: Complication of anesthesia No date: GERD (gastroesophageal reflux disease) No date: History of kidney stones     Comment:  h/o No date: Low back pain No date: PONV (postoperative nausea and vomiting) No date: Sleep apnea     Comment:  USES CPAP No date: Vertigo  Past Surgical History: 06/23/2017: ANAL FISTULOTOMY; N/A     Comment:  Procedure: ANAL FISTULOTOMY;  Surgeon: Dessa Reyes ORN, MD;  Location: [facility] ORS;  Service: General;                Laterality: N/A; No date: APPENDECTOMY 2016: COLONOSCOPY     Comment:  Dr Viktoria No date: HERNIA REPAIR; Bilateral     Comment:  10 01/28/2018: POSTERIOR CERVICAL LAMINECTOMY; N/A     Comment:  Procedure: LEFT CERVICAL SEVEN  THORACIC ONE                LAMENECTOMY/FORAMINOTOMY;  Surgeon: Onetha Kuba, MD;                Location: [facility] OR;  Service: Neurosurgery;  Laterality:               N/A; No date: SHOULDER ARTHROSCOPY No date: TONSILLECTOMY No date: WISDOM TOOTH EXTRACTION     Reproductive/Obstetrics negative OB ROS                              Anesthesia Physical Anesthesia Plan  ASA: 3  Anesthesia Plan: General ETT   Post-op Pain Management:    Induction: Intravenous  PONV Risk Score and Plan: 3 and Dexamethasone , Ondansetron , Propofol  infusion, TIVA and Midazolam   Airway Management Planned: Oral ETT  Additional Equipment:   Intra-op Plan:   Post-operative Plan: Extubation in OR  Informed Consent: I have reviewed the patients History and Physical, chart, labs and discussed the procedure including the risks, benefits and alternatives for the proposed anesthesia with the patient or authorized representative who has indicated his/her understanding and acceptance.     Dental Advisory Given  Plan Discussed with: Anesthesiologist, CRNA and Surgeon  Anesthesia Plan Comments: (Patient consented for risks of anesthesia including but not limited to:  -  adverse reactions to medications - damage to eyes, teeth, lips or other oral mucosa - nerve damage due to positioning  - sore throat or hoarseness - Damage to heart, brain, nerves, lungs, other parts of body or loss of life  Patient voiced understanding and assent.)        Anesthesia Quick Evaluation

## 2023-11-24 NOTE — Progress Notes (Signed)
 PHARMACIST - PHYSICIAN COMMUNICATION  CONCERNING:  Enoxaparin (Lovenox) for DVT Prophylaxis    RECOMMENDATION: Patient was prescribed enoxaprin 40mg  q24 hours for VTE prophylaxis.   Filed Weights   11/24/23 0922  Weight: 109.8 kg (242 lb)    Body mass index is 32.82 kg/m.  Estimated Creatinine Clearance: 108.7 mL/min (by C-G formula based on SCr of 0.89 mg/dL).   Based on [facility] policy patient is candidate for enoxaparin 0.5mg /kg TBW SQ every 24 hours based on BMI being >30.   DESCRIPTION: Pharmacy has adjusted enoxaparin dose per [facility] policy.  Patient is now receiving enoxaparin 55 mg every 24 hours    Annabella LOISE Banks, PharmD Clinical Pharmacist  11/24/2023 6:10 PM

## 2023-11-24 NOTE — Interval H&P Note (Signed)
 History and Physical Interval Note:  11/24/2023 10:08 AM  Drew Lopez  has presented today for surgery, with the diagnosis of M54.12 Cervical radiculopathy M48.02 Cervical stenosis of spinal canal R29.898  Right arm weakness.  The various methods of treatment have been discussed with the patient and family. After consideration of risks, benefits and other options for treatment, the patient has consented to  Procedure(s) with comments: ANTERIOR CERVICAL DECOMPRESSION/DISCECTOMY FUSION 3 LEVELS (N/A) - C4-7 ANTERIOR CERVICAL DISCECTOMY AND FUSION FORAMINOTOMY, SPINE, CERVICAL, 1 LEVEL (N/A) - RIGHT C7-T1 LAMINOFORAMINOTOMY as a surgical intervention.  The patient's history has been reviewed, patient examined, no change in status, stable for surgery.  I have reviewed the patient's chart and labs.  Questions were answered to the patient's satisfaction.    Heart sounds normal no MRG. Chest Clear to Auscultation Bilaterally.   [name]

## 2023-11-24 NOTE — Anesthesia Postprocedure Evaluation (Signed)
 Anesthesia Post Note  Patient: Drew Lopez  Procedure(s) Performed: ANTERIOR CERVICAL DECOMPRESSION/DISCECTOMY FUSION 3 LEVELS FORAMINOTOMY, SPINE, CERVICAL, 1 LEVEL  Patient location during evaluation: PACU Anesthesia Type: General Level of consciousness: awake and alert Pain management: pain level controlled Vital Signs Assessment: post-procedure vital signs reviewed and stable Respiratory status: spontaneous breathing, nonlabored ventilation and respiratory function stable Cardiovascular status: blood pressure returned to baseline and stable Postop Assessment: no apparent nausea or vomiting Anesthetic complications: no   No notable events documented.   Last Vitals:  Vitals:   11/24/23 1802 11/24/23 1945  BP: 136/74 (!) 156/71  Pulse: 100 99  Resp: 16   Temp: 36.8 C 36.7 C  SpO2: 94% 99%    Last Pain:  Vitals:   11/24/23 2001  TempSrc:   PainSc: 8                  Camellia Merilee Louder

## 2023-11-24 NOTE — Discharge Instructions (Signed)

## 2023-11-25 ENCOUNTER — Encounter: Payer: Self-pay | Admitting: Neurosurgery

## 2023-11-25 ENCOUNTER — Other Ambulatory Visit: Payer: Self-pay

## 2023-11-25 DIAGNOSIS — M5412 Radiculopathy, cervical region: Secondary | ICD-10-CM | POA: Diagnosis not present

## 2023-11-25 MED ORDER — CELECOXIB 200 MG PO CAPS
200.0000 mg | ORAL_CAPSULE | Freq: Two times a day (BID) | ORAL | 2 refills | Status: AC
  Filled 2023-11-25: qty 60, 30d supply, fill #0
  Filled 2024-01-11: qty 60, 30d supply, fill #1

## 2023-11-25 MED ORDER — METHOCARBAMOL 500 MG PO TABS
500.0000 mg | ORAL_TABLET | Freq: Four times a day (QID) | ORAL | 0 refills | Status: AC | PRN
  Filled 2023-11-25: qty 120, 30d supply, fill #0

## 2023-11-25 MED ORDER — POLYETHYLENE GLYCOL 3350 17 GM/SCOOP PO POWD
17.0000 g | Freq: Every day | ORAL | 0 refills | Status: DC | PRN
  Filled 2023-11-25: qty 238, 14d supply, fill #0

## 2023-11-25 MED ORDER — MUPIROCIN 2 % EX OINT: 1.0000 | TOPICAL_OINTMENT | Freq: Two times a day (BID) | CUTANEOUS | 0 refills | Status: AC

## 2023-11-25 MED ORDER — OXYCODONE HCL 5 MG PO TABS
5.0000 mg | ORAL_TABLET | ORAL | 0 refills | Status: DC | PRN
  Filled 2023-11-25: qty 45, 9d supply, fill #0

## 2023-11-25 MED ORDER — ACETAMINOPHEN 500 MG PO TABS: 1000.0000 mg | ORAL_TABLET | Freq: Four times a day (QID) | ORAL | Status: AC

## 2023-11-25 MED ORDER — SENNA 8.6 MG PO TABS
1.0000 | ORAL_TABLET | Freq: Two times a day (BID) | ORAL | 0 refills | Status: DC | PRN
  Filled 2023-11-25: qty 30, 15d supply, fill #0

## 2023-11-25 MED ORDER — CHLORHEXIDINE GLUCONATE 4 % EX SOLN: 1.0000 | CUTANEOUS | 1 refills | Status: AC

## 2023-11-25 NOTE — Evaluation (Signed)
 Physical Therapy Evaluation Patient Details Name: Drew Lopez MRN: [id number] DOB: 15-Mar-1960 Today's Date: 11/25/2023  History of Present Illness  Pt is 63 y/o s/p C4-C7 ACDF, C7-T1 foraminotomy (11/25/23)  Clinical Impression  Patient seated in recliner upon arrival to room; supportive wife at bedside throughout session.  Patient alert and oriented, follows commands and agreeable to participation with treatment session.  Eager for progressive mobility efforts.  Endorses cervical pain 4-5/10; meds received prior to session.  Does report mild dizziness with earlier mobility efforts; now resolved.  Orthostatics assessed and negative; no reports of dizziness, lightheadedness throughout session. Bilat UE/LE strength and ROM grossly symmetrical and WFL; no focal weakness or sensory deficits appreciated.  Does report mild radicular symptoms to L posterior UE (triceps area), but already improving with time.  Bowel and bladder function WNL Able to complete sit/stand, basic transfers and gait (220') without assist device, sup/mod indep; stairs (up/down 4 with bilat rails), cga/close sup.  Gait pattern significant for reciprocal stepping pattern with good step height/length; good cadence, dynamic balance and overall gait peformance. Would benefit from skilled PT to address above deficits and promote optimal return to PLOF.; recommend post-acute PT follow up as indicated by interdisciplinary care team.          If plan is discharge home, recommend the following: A little help with walking and/or transfers;A little help with bathing/dressing/bathroom   Can travel by private vehicle        Equipment Recommendations    Recommendations for Other Services       Functional Status Assessment Patient has had a recent decline in their functional status and demonstrates the ability to make significant improvements in function in a reasonable and predictable amount of time.     Precautions / Restrictions  Precautions Precautions: Cervical Cervical Brace: Other (comment) (OOB)      Mobility  Bed Mobility               General bed mobility comments: seated in recliner chair at beginning and end of session    Transfers Overall transfer level: Needs assistance Equipment used: None Transfers: Sit to/from Stand Sit to Stand: Modified independent (Device/Increase time)                Ambulation/Gait Ambulation/Gait assistance: Supervision, Modified independent (Device/Increase time) Gait Distance (Feet):  (250) Assistive device: None         General Gait Details: reciprocal stepping pattern with good step height/length; good cadence, dynamic balance and overall gait peformance.  Stairs Stairs: Yes Stairs assistance: Supervision Stair Management: Two rails Number of Stairs: 4 General stair comments: reciprocal stepping pattern with ascending and descendin; no buckling, LOB or safety concern  Wheelchair Mobility     Tilt Bed    Modified Rankin (Stroke Patients Only)       Balance Overall balance assessment: Modified Independent                                           Pertinent Vitals/Pain Pain Assessment Pain Assessment: 0-10 Pain Score: 4  Pain Location: surgical incision Pain Descriptors / Indicators: Discomfort, Sore Pain Intervention(s): Limited activity within patient's tolerance, Monitored during session, Repositioned, Premedicated before session    Home Living Family/patient expects to be discharged to:: Private residence Living Arrangements: Spouse/significant other Available Help at Discharge: Family;Available PRN/intermittently Type of Home: House Home Access: Stairs to enter  Entrance Stairs-Rails: Right;Left Entrance Stairs-Number of Steps: 2-3   Home Layout: One level Home Equipment: None      Prior Function Prior Level of Function : Independent/Modified Independent;Working/employed               ADLs  Comments: 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     Extremity/Trunk Assessment   Upper Extremity Assessment Upper Extremity Assessment: Overall WFL for tasks assessed (endorses mild radicular symptoms L posterior UE (triceps area))    Lower Extremity Assessment Lower Extremity Assessment: Overall WFL for tasks assessed (grossly 5/5 throughout; denies paresthesia, radicular symptoms; no saddle paresthesia or difficulty with bowel/bladder)    Cervical / Trunk Assessment Cervical / Trunk Assessment: Normal  Communication   Communication Communication: No apparent difficulties    Cognition Arousal: Alert Behavior During Therapy: WFL for tasks assessed/performed   PT - Cognitive impairments: No apparent impairments                         Following commands: Intact       Cueing Cueing Techniques: Verbal cues     General Comments      Exercises Other Exercises Other Exercises: Dons/doffs cervical collar for mobility efforts with set sup/supervision Other Exercises: Reviewed recommendations for collar use, cervical precautions, car transfer technique; patient voiced understanding of all information.   Assessment/Plan    PT Assessment Patient needs continued PT services  PT Problem List Decreased activity tolerance;Decreased balance;Decreased mobility       PT Treatment Interventions DME instruction;Gait training;Stair training;Functional mobility training;Therapeutic activities;Therapeutic exercise;Balance training;Patient/family education    PT Goals (Current goals can be found in the Care Plan section)  Acute Rehab PT Goals Patient Stated Goal: to go home PT Goal Formulation: With patient Time For Goal Achievement: 12/09/23 Potential to Achieve Goals: Good    Frequency Min 3X/week     Co-evaluation               AM-PAC PT 6 Clicks Mobility  Outcome Measure Help needed turning from your back to your side while in a flat bed  without using bedrails?: None Help needed moving from lying on your back to sitting on the side of a flat bed without using bedrails?: None Help needed moving to and from a bed to a chair (including a wheelchair)?: None Help needed standing up from a chair using your arms (e.g., wheelchair or bedside chair)?: None Help needed to walk in hospital room?: None Help needed climbing 3-5 steps with a railing? : A Little 6 Click Score: 23    End of Session   Activity Tolerance: Patient tolerated treatment well Patient left: in chair;with call bell/phone within reach;with family/visitor present Nurse Communication: Mobility status PT Visit Diagnosis: Difficulty in walking, not elsewhere classified (R26.2)    Time: 1325-1340 PT Time Calculation (min) (ACUTE ONLY): 15 min   Charges:   PT Evaluation $PT Eval Low Complexity: 1 Low   PT General Charges $$ ACUTE PT VISIT: 1 Visit         Siyon Linck H. Delores, PT, DPT, NCS 11/25/23, 2:01 PM [phone]

## 2023-11-25 NOTE — Evaluation (Signed)
 Occupational Therapy Evaluation Patient Details Name: Drew Lopez MRN: [id number] DOB: March 01, 1960 Today's Date: 11/25/2023   History of Present Illness   Pt is 63 y/o s/p C4-C7 ACDF.     Clinical Impressions Upon entering the room, pt supine in bed and agreeable to OT evaluation. Pt reports living at home with wife and being Ind at baseline with self care, IADLs, and mobility without use of AD. Pt works full time as an Electronics engineer. OT reviews precautions and adjusts cervical collar on pt for functional mobility. Pt able to demonstrate figure four position for LB dressing.  Pt stands with supervision without use of AD. Pt ambulates into bathroom and demonstrates ability to transfer on/off of commode without assistance and perform hand hygiene in standing. Pt ambulating ~ 200' with supervision progressing to independent and returning to room at end of session. His wife will be able to assist at home as needed. Pt with no further concerns at this time. OT to complete order.        Functional Status Assessment   Patient has not had a recent decline in their functional status     Equipment Recommendations   None recommended by OT      Precautions/Restrictions   Precautions Precautions: Cervical Required Braces or Orthoses: Cervical Brace Cervical Brace: Other (comment) (OOB)     Mobility Bed Mobility               General bed mobility comments: seated in recliner chair at beginning and end of session    Transfers Overall transfer level: Modified independent Equipment used: None                      Balance Overall balance assessment: Independent                                         ADL either performed or assessed with clinical judgement   ADL Overall ADL's : Needs assistance/impaired                         Toilet Transfer: Supervision/safety                   Vision Patient Visual Report: No  change from baseline              Pertinent Vitals/Pain Pain Assessment Pain Assessment: 0-10 Pain Score: 2  Pain Location: surgical incision Pain Descriptors / Indicators: Discomfort Pain Intervention(s): Monitored during session, Repositioned     Extremity/Trunk Assessment Upper Extremity Assessment Upper Extremity Assessment: Overall WFL for tasks assessed (some radiating down R UE but much better than before surgery)   Lower Extremity Assessment Lower Extremity Assessment: Overall WFL for tasks assessed   Cervical / Trunk Assessment Cervical / Trunk Assessment: Normal   Communication Communication Communication: No apparent difficulties   Cognition Arousal: Alert Behavior During Therapy: WFL for tasks assessed/performed Cognition: No apparent impairments                               Following commands: Intact       Cueing  General Comments   Cueing Techniques: Verbal cues              Home Living Family/patient expects to be discharged to:: Private residence Living  Arrangements: Spouse/significant other Available Help at Discharge: Family;Available PRN/intermittently Type of Home: House Home Access: Stairs to enter [employer] of Steps: 2-3 Entrance Stairs-Rails: Right;Left Home Layout: One level     Bathroom Shower/Tub: Producer, television/film/video: Standard     Home Equipment: None          Prior Functioning/Environment Prior Level of Function : Independent/Modified Independent;Working/employed               ADLs Comments: 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            OT Goals(Current goals can be found in the care plan section)   Acute Rehab OT Goals Patient Stated Goal: to go home OT Goal Formulation: With patient/family Time For Goal Achievement: 11/25/23 Potential to Achieve Goals: Fair   AM-PAC OT 6 Clicks Daily Activity     Outcome Measure Help from another person  eating meals?: None Help from another person taking care of personal grooming?: None Help from another person toileting, which includes using toliet, bedpan, or urinal?: None Help from another person bathing (including washing, rinsing, drying)?: None Help from another person to put on and taking off regular upper body clothing?: None Help from another person to put on and taking off regular lower body clothing?: None 6 Click Score: 24   End of Session Nurse Communication: Mobility status  Activity Tolerance: Patient tolerated treatment well Patient left: in chair;with family/visitor present                   Time: 1030-1047 OT Time Calculation (min): 17 min Charges:  OT General Charges $OT Visit: 1 Visit OT Evaluation $OT Eval Low Complexity: 1 Low OT Treatments $Therapeutic Activity: 8-22 mins  Izetta Claude, MS, OTR/L , CBIS ascom [phone]  11/25/23, 1:21 PM

## 2023-11-25 NOTE — Discharge Summary (Signed)
 Discharge Summary  Patient ID: Drew Lopez MRN: [id number] DOB/AGE: 63/02/1960 63 y.o.  Admit date: 11/24/2023 Discharge date: 11/25/2023  Admission Diagnoses: M54.12 Cervical radiculopathy, M48.02 Cervical stenosis of spinal canal, R29.898 Right arm weakness   Discharge Diagnoses:  Principal Problem:   S/P cervical spinal fusion Active Problems:   Cervical stenosis of spinal canal   Right arm weakness   Cervical radiculopathy   Discharged Condition: good  Hospital Course:  Darrill Vreeland is a pleasant 63 y.o presenting with cervical radiculopathy s/p C4-7 ACDF and right C7-T1 foraminotomy. His intraoperative course was uncomplicated. He was admitted overnight for pain control, drain output monitoring, and therapy evaluation. His posterior drain output was minimal and removed on the morning of POD1. His anterior drain was monitored into the afternoon of POD 1 and removed. His pain was well controlled and he was cleared from discharge home from PT. He was given prescriptions for pain medication, muscle relaxer, and stool softener to take as needed.   Consults: None  Significant Diagnostic Studies: NA  Treatments: surgery: as above. Please see separately dictated operative report for further details   Discharge Exam: Blood pressure (!) 145/88, pulse 95, temperature 97.8 F (36.6 C), temperature source Oral, resp. rate 17, height 6' (1.829 m), weight 109.8 kg, SpO2 97%. AA Ox3 CNI   Strength: Side Biceps Triceps Deltoid Interossei Grip Wrist Ext. Wrist Flex.  R 5 5 5 4 4  4+ 4+  L 5 5 5 5 5 5 5     Side Iliopsoas Quads Hamstring PF DF EHL  R 5 5 5 5 5 5   L 5 5 5 5 5 5    Incisions: c/d/I with dressings in place  Disposition: Discharge disposition: 01-Home or Self Care       Discharge Instructions     Incentive spirometry RT   Complete by: As directed    Remove dressing in 48 hours   Complete by: As directed       Allergies as of 11/25/2023       Reactions    Promethazine Other (See Comments)   Makes pt VERY JITTERY Makes patient very jittery Makes pt VERY JITTERY        Medication List     STOP taking these medications    ibuprofen 400 MG tablet Commonly known as: ADVIL       TAKE these medications    acetaminophen  500 MG tablet Commonly known as: TYLENOL  Take 2 tablets (1,000 mg total) by mouth every 6 (six) hours.   celecoxib 200 MG capsule Commonly known as: CeleBREX Take 1 capsule (200 mg total) by mouth 2 (two) times daily.   chlorhexidine  4 % external liquid Commonly known as: HIBICLENS  Apply 15 mLs (1 Application total) topically as directed for 30 doses. Use as directed daily for 5 days every other week for 6 weeks.   citalopram  20 MG tablet Commonly known as: CELEXA  TAKE 1 TABLET(20 MG) BY MOUTH DAILY   docusate sodium 100 MG capsule Commonly known as: COLACE Take 100 mg by mouth every other day.   fexofenadine 180 MG tablet Commonly known as: ALLEGRA Take 180 mg by mouth at bedtime.   finasteride  5 MG tablet Commonly known as: PROSCAR  TAKE 1 TABLET(5 MG) BY MOUTH DAILY   fluticasone 50 MCG/ACT nasal spray Commonly known as: FLONASE Place 1 spray into both nostrils daily as needed for allergies or rhinitis.   methocarbamol  500 MG tablet Commonly known as: ROBAXIN  Take 1 tablet (500 mg total) by  mouth every 6 (six) hours as needed for muscle spasms.   multivitamin with minerals Tabs tablet Take 1 tablet by mouth in the morning.   mupirocin ointment 2 % Commonly known as: BACTROBAN Place 1 Application into the nose 2 (two) times daily for 60 doses. Use as directed 2 times daily for 5 days every other week for 6 weeks.   omeprazole  20 MG capsule Commonly known as: PRILOSEC Take 1 capsule (20 mg total) by mouth daily. TAKE 1 CAPSULE(20 MG) BY MOUTH DAILY   oxyCODONE  5 MG immediate release tablet Commonly known as: Oxy IR/ROXICODONE  Take 1-2 tablets (5-10 mg total) by mouth every 4 (four) hours as  needed for moderate pain (pain score 4-6).   polyethylene glycol powder 17 GM/SCOOP powder Commonly known as: GLYCOLAX/MIRALAX Take 17 g by mouth daily as needed for moderate constipation. Dissolve 1 capful (17g) in 4-8 ounces of liquid and take by mouth daily.   senna 8.6 MG Tabs tablet Commonly known as: SENOKOT Take 1 tablet (8.6 mg total) by mouth 2 (two) times daily as needed for mild constipation.   tadalafil  20 MG tablet Commonly known as: CIALIS  SMARTSIG:0.5-1 Tablet(s) By Mouth Every Other Day PRN         Signed: Edsel Jama Goods 11/25/2023, 3:48 PM

## 2023-11-25 NOTE — Progress Notes (Signed)
 DISCHARGE NOTE:  Pt dc with IV removed and instructions given. Pt received medications delivered to hospital room. Pt given extra ice packs for home use. Pt has both TED hose on and in place. Pt wheeled down to medical mall entrance by staff. Pt's wife provided transportation.

## 2023-11-25 NOTE — Progress Notes (Signed)
   Neurosurgery Progress Note  History: Drew Lopez is s/p C4-7 ACDF and right C7-T1 foraminotomy  POD1: Doing well this morning with expected posterior neck pain. He is having a little radicular pain in left triceps  Physical Exam: Vitals:   11/25/23 0545 11/25/23 0722  BP: (!) 140/79 (!) 141/87  Pulse: 86 81  Resp: 16 17  Temp: 98.1 F (36.7 C) 98.2 F (36.8 C)  SpO2: 98% 97%    AA Ox3 CNI  Strength: Side Biceps Triceps Deltoid Interossei Grip Wrist Ext. Wrist Flex.  R 5 5 5 4 4  4+ 4+  L 5 5 5 5 5 5 5     Side Iliopsoas Quads Hamstring PF DF EHL  R 5 5 5 5 5 5   L 5 5 5 5 5 5    Incisions: c/d/I with dressings in place  HV: anterior 65 since surgery. Posterior 40 since surgery   Data:  Other tests/results: NA  Assessment/Plan:  Drew Lopez is a 63 y.o presenting with right arm pain and weakness s/p  C4-7 ACDF and right C7-T1 foraminotomy  - mobilize - pain control - DVT prophylaxis - will remove posterior drain this morning. Continue to monitor anterior drain. Will remove this afternoon depending on output.  - PTOT  Edsel Goods PA-C Department of Neurosurgery

## 2023-11-26 ENCOUNTER — Telehealth: Payer: Self-pay | Admitting: Neurosurgery

## 2023-11-26 NOTE — Telephone Encounter (Signed)
 Called patient wife to follow up on triage call regarding wound complications. She states that the wound is no longer bleeding and that the patient does not have a wound vac or drain anymore. When she had removed the dressing, some of the surgical glue was removed with it and the wound began to bleed. She states that she put steristrips on the wound and held pressure until it stopped bleeding. She covered the wound with a dry gauze bandage. Advised her to check the wound daily and change the dressing to monitor the site. Additionally advised her to watch for signs of infection, including redness, purulent drainage, pain, swelling, and fever. Discussed sending a picture of the wound site if she had any concerns for our clinical staff to review via mychart. Patient wife expressed feeling comfortable with this plan.

## 2023-11-26 NOTE — Telephone Encounter (Signed)
 Please see below message from the answering service and advise.     Media Information   Document Information  AMB Correspondence  NEUROSURGERY ANSWERING SERVICE CALL  11/25/2023 08:10  Attached To:  Medford DELENA Edin  Source Information  Default, Provider, MD

## 2023-12-08 NOTE — Progress Notes (Unsigned)
   REFERRING PHYSICIAN:  Bertrum Charlie CROME, Md [address] [place],  [place] [address]  DOS: 11/24/23 C4-5 ACDF  HISTORY OF PRESENT ILLNESS: Drew Lopez is about 2 weeks status post ACDF. Overall, he is doing well. He has had some ongoing trouble with vertigo since surgery and is taking meclizine for this. He feels it is improving. He also feels as though his right arm is stronger and has improved numbness and tingling. He is not currently taking anything for pain  PHYSICAL EXAMINATION:  NEUROLOGICAL:  General: In no acute distress.   Awake, alert, oriented to person, place, and time.  Pupils equal round and reactive to light.  Facial tone is symmetric.  Tongue protrusion is midline.  There is no pronator drift.  Strength: Side Biceps Triceps Deltoid Interossei Grip Wrist Ext. Wrist Flex.  R 5 5 5  4+ 4+ 5 5  L 5 5 5 5 5 5 5    Incisions c/d/I and healing well  Imaging:  No interval imaging to review  Assessment / Plan: Drew Lopez is doing well after recent ACDF . Most of his pre-op symptoms have improved. He continues to have vertigo which sounds positional in nature but feels it is improving. We briefly discussed referral to ENT but he would like to hold off for now. We discussed activity escalation and I have advised the patient to lift up to 10 pounds until 6 weeks after surgery, then increase up to 25 pounds until 12 weeks after surgery.  After 12 weeks post-op, the patient advised to increase activity as tolerated. he will return to clinic in approximately 4 weeks to see Dr. Clois with cervical xrays prior or sooner should he have any questions or concerns. He expressed understanding and was in agreement with this plan.  Edsel Goods PA-C Dept of Neurosurgery

## 2023-12-09 ENCOUNTER — Ambulatory Visit: Admitting: Neurosurgery

## 2023-12-09 ENCOUNTER — Encounter: Payer: Self-pay | Admitting: Neurosurgery

## 2023-12-09 VITALS — BP 140/88 | Temp 98.7°F | Wt 238.0 lb

## 2023-12-09 DIAGNOSIS — M5412 Radiculopathy, cervical region: Secondary | ICD-10-CM

## 2023-12-09 DIAGNOSIS — R29898 Other symptoms and signs involving the musculoskeletal system: Secondary | ICD-10-CM

## 2023-12-09 DIAGNOSIS — Z981 Arthrodesis status: Secondary | ICD-10-CM

## 2023-12-31 ENCOUNTER — Other Ambulatory Visit: Payer: Self-pay

## 2023-12-31 DIAGNOSIS — R29898 Other symptoms and signs involving the musculoskeletal system: Secondary | ICD-10-CM

## 2024-01-04 ENCOUNTER — Ambulatory Visit

## 2024-01-04 ENCOUNTER — Ambulatory Visit: Admitting: Neurosurgery

## 2024-01-04 ENCOUNTER — Encounter: Payer: Self-pay | Admitting: Neurosurgery

## 2024-01-04 VITALS — BP 136/74 | Temp 98.3°F | Ht 72.0 in | Wt 242.0 lb

## 2024-01-04 DIAGNOSIS — Z981 Arthrodesis status: Secondary | ICD-10-CM

## 2024-01-04 DIAGNOSIS — R29898 Other symptoms and signs involving the musculoskeletal system: Secondary | ICD-10-CM

## 2024-01-04 NOTE — Progress Notes (Signed)
   REFERRING PHYSICIAN:  Bertrum Charlie CROME, Md [address] [place],  [place] [address]  DOS: 11/24/23 C4-7 ACDF, R C7/T1 LF  HISTORY OF PRESENT ILLNESS: Drew Lopez is status post ACDF. Overall, he is doing well.  His strength has returned.  He has had some difficulty with intrascapular discomfort.  He had some vertigo since surgery but it is getting better.  It is mostly present in the mornings and when he turns his head to the left.     PHYSICAL EXAMINATION:  NEUROLOGICAL:  General: In no acute distress.   Awake, alert, oriented to person, place, and time.  Pupils equal round and reactive to light.  Facial tone is symmetric.  Tongue protrusion is midline.  There is no pronator drift.  Strength: Side Biceps Triceps Deltoid Interossei Grip Wrist Ext. Wrist Flex.  R 5 5 5 5 5 5 5   L 5 5 5 5 5 5 5    Incisions c/d/I and healing well  Imaging:  No complications noted Assessment / Plan: Drew Lopez is doing well after recent ACDF and decompression.  He is doing extremely well and has had returned of his strength.  We discussed beginning some exercises for his neck.  He is cleared to begin deep tissue massage.  We discussed activity limitations.  He will go back to work after Thanksgiving.   Reeves Daisy Dept of Neurosurgery

## 2024-01-24 ENCOUNTER — Other Ambulatory Visit: Payer: Self-pay | Admitting: Orthopedic Surgery

## 2024-01-24 DIAGNOSIS — G959 Disease of spinal cord, unspecified: Secondary | ICD-10-CM

## 2024-02-01 NOTE — Progress Notes (Unsigned)
° °  REFERRING PHYSICIAN:  Bertrum Charlie CROME, Md [address] [place],  [place] [address]  DOS: 11/24/23 ACDF C4-C7, right C7-T1 foraminotomy  HISTORY OF PRESENT ILLNESS:  He was doing well at his last visit. Strength was better, but he still had some scapular discomfort and some vertigo that was worse in mornings and when he turned his head to left.   He returned back to work after thanksgiving.   He is doing well. He has no neck or arm pain. He has done well getting back to work.    PHYSICAL EXAMINATION:  NEUROLOGICAL:  General: In no acute distress.   Awake, alert, oriented to person, place, and time.  Pupils equal round and reactive to light.  Facial tone is symmetric.    Strength: Side Biceps Triceps Deltoid Interossei Grip Wrist Ext. Wrist Flex.  R 5 5 5 5 5 5 5   L 5 5 5 5 5 5 5    Incision well healed  Imaging:  Cervical xrays dated 02/03/24:  No complications noted.  Report for above xrays not yet available.   Assessment / Plan: Drew Lopez is doing well s/p above surgery. Treatment options reviewed with patient and following plan made:   - He can slowly return to activity as tolerated.  - Follow up in 6 months with repeat xrays.   Advised to contact the office if any questions or concerns arise.   Glade Boys PA-C Dept of Neurosurgery

## 2024-02-03 ENCOUNTER — Encounter: Admitting: Neurosurgery

## 2024-02-03 ENCOUNTER — Ambulatory Visit: Admitting: Orthopedic Surgery

## 2024-02-03 ENCOUNTER — Ambulatory Visit (INDEPENDENT_AMBULATORY_CARE_PROVIDER_SITE_OTHER)

## 2024-02-03 ENCOUNTER — Encounter: Payer: Self-pay | Admitting: Orthopedic Surgery

## 2024-02-03 VITALS — BP 124/80 | Temp 97.9°F | Wt 242.0 lb

## 2024-02-03 DIAGNOSIS — Z981 Arthrodesis status: Secondary | ICD-10-CM | POA: Diagnosis not present

## 2024-02-03 DIAGNOSIS — G959 Disease of spinal cord, unspecified: Secondary | ICD-10-CM | POA: Diagnosis not present

## 2024-02-03 DIAGNOSIS — M5412 Radiculopathy, cervical region: Secondary | ICD-10-CM

## 2024-02-03 DIAGNOSIS — R29898 Other symptoms and signs involving the musculoskeletal system: Secondary | ICD-10-CM

## 2024-08-03 ENCOUNTER — Ambulatory Visit
